# Patient Record
Sex: Female | Born: 1965 | Race: White | Hispanic: No | Marital: Single | Smoking: Never smoker
Health system: Southern US, Community
[De-identification: ages and names within clinical notes are randomized; demographics above are authoritative.]

## PROBLEM LIST (undated history)

## (undated) DIAGNOSIS — E785 Hyperlipidemia, unspecified: Secondary | ICD-10-CM

## (undated) DIAGNOSIS — I10 Essential (primary) hypertension: Secondary | ICD-10-CM

## (undated) DIAGNOSIS — E119 Type 2 diabetes mellitus without complications: Secondary | ICD-10-CM

## (undated) HISTORY — DX: Essential (primary) hypertension: I10

## (undated) HISTORY — DX: Hyperlipidemia, unspecified: E78.5

---

## 2005-02-21 HISTORY — PX: ABDOMINAL HYSTERECTOMY: SHX81

## 2007-02-22 HISTORY — PX: CHOLECYSTECTOMY: SHX55

## 2012-12-18 ENCOUNTER — Other Ambulatory Visit (HOSPITAL_COMMUNITY): Payer: Self-pay | Admitting: Family Medicine

## 2012-12-18 DIAGNOSIS — Z139 Encounter for screening, unspecified: Secondary | ICD-10-CM

## 2012-12-24 ENCOUNTER — Ambulatory Visit (HOSPITAL_COMMUNITY)
Admission: RE | Admit: 2012-12-24 | Discharge: 2012-12-24 | Disposition: A | Discharge status: Home / Self Care | Payer: BC Managed Care – PPO | Source: Ambulatory Visit | Attending: Family Medicine | Admitting: Family Medicine

## 2012-12-24 DIAGNOSIS — Z139 Encounter for screening, unspecified: Secondary | ICD-10-CM

## 2012-12-24 DIAGNOSIS — Z1231 Encounter for screening mammogram for malignant neoplasm of breast: Secondary | ICD-10-CM | POA: Insufficient documentation

## 2012-12-26 ENCOUNTER — Other Ambulatory Visit: Payer: Self-pay | Admitting: Family Medicine

## 2012-12-26 DIAGNOSIS — R928 Other abnormal and inconclusive findings on diagnostic imaging of breast: Secondary | ICD-10-CM

## 2013-01-09 ENCOUNTER — Other Ambulatory Visit: Payer: Self-pay | Admitting: Family Medicine

## 2013-01-09 ENCOUNTER — Ambulatory Visit (HOSPITAL_COMMUNITY)
Admission: RE | Admit: 2013-01-09 | Discharge: 2013-01-09 | Disposition: A | Discharge status: Home / Self Care | Payer: BC Managed Care – PPO | Source: Ambulatory Visit | Attending: Family Medicine | Admitting: Family Medicine

## 2013-01-09 ENCOUNTER — Other Ambulatory Visit (HOSPITAL_COMMUNITY): Payer: Self-pay | Admitting: Family Medicine

## 2013-01-09 DIAGNOSIS — R928 Other abnormal and inconclusive findings on diagnostic imaging of breast: Secondary | ICD-10-CM

## 2013-06-11 ENCOUNTER — Other Ambulatory Visit (HOSPITAL_COMMUNITY): Payer: Self-pay | Admitting: Family Medicine

## 2013-06-11 DIAGNOSIS — N6310 Unspecified lump in the right breast, unspecified quadrant: Secondary | ICD-10-CM

## 2013-06-11 DIAGNOSIS — Z09 Encounter for follow-up examination after completed treatment for conditions other than malignant neoplasm: Secondary | ICD-10-CM

## 2013-07-10 ENCOUNTER — Ambulatory Visit (HOSPITAL_COMMUNITY)
Admission: RE | Admit: 2013-07-10 | Discharge: 2013-07-10 | Disposition: A | Discharge status: Home / Self Care | Payer: BC Managed Care – PPO | Source: Ambulatory Visit | Attending: Family Medicine | Admitting: Family Medicine

## 2013-07-10 DIAGNOSIS — N6009 Solitary cyst of unspecified breast: Secondary | ICD-10-CM | POA: Insufficient documentation

## 2013-07-10 DIAGNOSIS — N6310 Unspecified lump in the right breast, unspecified quadrant: Secondary | ICD-10-CM

## 2013-07-10 DIAGNOSIS — Z09 Encounter for follow-up examination after completed treatment for conditions other than malignant neoplasm: Secondary | ICD-10-CM

## 2014-01-24 ENCOUNTER — Other Ambulatory Visit (HOSPITAL_COMMUNITY): Payer: Self-pay | Admitting: Family Medicine

## 2014-01-24 DIAGNOSIS — Z09 Encounter for follow-up examination after completed treatment for conditions other than malignant neoplasm: Secondary | ICD-10-CM

## 2014-02-04 ENCOUNTER — Ambulatory Visit (HOSPITAL_COMMUNITY)
Admission: RE | Admit: 2014-02-04 | Discharge: 2014-02-04 | Disposition: A | Discharge status: Home / Self Care | Payer: BC Managed Care – PPO | Source: Ambulatory Visit | Attending: Family Medicine | Admitting: Family Medicine

## 2014-02-04 DIAGNOSIS — Z1231 Encounter for screening mammogram for malignant neoplasm of breast: Secondary | ICD-10-CM | POA: Insufficient documentation

## 2014-02-04 DIAGNOSIS — Z09 Encounter for follow-up examination after completed treatment for conditions other than malignant neoplasm: Secondary | ICD-10-CM

## 2014-02-05 ENCOUNTER — Encounter: Payer: Self-pay | Admitting: Neurology

## 2014-02-06 ENCOUNTER — Ambulatory Visit (INDEPENDENT_AMBULATORY_CARE_PROVIDER_SITE_OTHER): Payer: BC Managed Care – PPO | Admitting: Neurology

## 2014-02-06 ENCOUNTER — Encounter: Payer: Self-pay | Admitting: Neurology

## 2014-02-06 VITALS — BP 105/72 | HR 71 | Temp 98.5°F | Ht 63.5 in | Wt 266.0 lb

## 2014-02-06 DIAGNOSIS — G4719 Other hypersomnia: Secondary | ICD-10-CM

## 2014-02-06 DIAGNOSIS — G4733 Obstructive sleep apnea (adult) (pediatric): Secondary | ICD-10-CM

## 2014-02-06 DIAGNOSIS — R351 Nocturia: Secondary | ICD-10-CM

## 2014-02-06 NOTE — Patient Instructions (Signed)

## 2014-02-06 NOTE — Progress Notes (Signed)
Subjective:    Patient ID: Yvonne Mcmillan is a 48 y.o. female.  HPI     Huston FoleySaima Grey Rakestraw, MD, PhD [facility] [address], [address] [address] [place], [place] [address]  Dear Lelon MastSamantha,  I saw your patient, Yvonne EdelsonMarianne Garis, upon your kind request in my neurologic clinic today for initial consultation of her sleep disorder, in particular, concern for underlying obstructive sleep apnea. The patient is unaccompanied today. As you know, Mr. Pricilla Holmucker is a 48 year old right-handed woman with an underlying medical history of hypertension, hyperlipidemia, and morbid obesity, who reports snoring, frequent nighttime awakenings, nonrestorative sleep, witnessed apneas and excessive daytime somnolence. Her Epworth sleepiness score is 16 out of 24 today. She has been trying to lose weight. She has reduced her soda and sweet tea intake to one per day. She is a restless sleep and tosses and turn. She lives with her parents, mother is [id number] and father is [phone]. She is adopted. She is single and has a cat. She has occasional nocturia. She has rare morning HAs. She denies RLS symptoms and is not known to kick in her sleep. His snoring can be mild to loud. Her mother has noted witnessed apneas. The patient herself has had gasping sensations while asleep. She does not wake up rested. She does not drink coffee and drinks alcohol rarely. She is a nonsmoker. She does not watch TV in bed.  Her Past Medical History Is Significant For: Past Medical History  Diagnosis Date  . Hypertension   . Hyperlipemia     Her Past Surgical History Is Significant For: Past Surgical History  Procedure Laterality Date  . Cholecystectomy  2009  . Abdominal hysterectomy  2007    Her Family History Is Significant For: Family History  Problem Relation Age of Onset  . Adopted: Yes  . Family history unknown: Yes    Her Social History Is Significant For: History   Social History  . Marital Status: Single     Spouse Name: N/A    Number of Children: 0  . Years of Education: college   Occupational History  .      [employer]    Social History Main Topics  . Smoking status: Never Smoker   . Smokeless tobacco: Never Used  . Alcohol Use: No  . Drug Use: No  . Sexual Activity: None   Other Topics Concern  . None   Social History Narrative   Patient consumes no caffeine    Her Allergies Are:  No Known Allergies:   Her Current Medications Are:  Outpatient Encounter Prescriptions as of 02/06/2014  Medication Sig  . Fish Oil OIL by Does not apply route daily. 2 gram daily  . lisinopril-hydrochlorothiazide (PRINZIDE,ZESTORETIC) 20-12.5 MG per tablet Take 1 tablet by mouth daily.  . [DISCONTINUED] rosuvastatin (CRESTOR) 20 MG tablet Take 20 mg by mouth daily.  :  Review of Systems:  Out of a complete 14 point review of systems, all are reviewed and negative with the exception of these symptoms as listed below:  Review of Systems  Constitutional: Positive for fatigue.  Musculoskeletal:       Cramps  Neurological:       Snoring    Objective:  Neurologic Exam  Physical Exam Physical Examination:   Filed Vitals:   02/06/14 0943  BP: 105/72  Pulse: 71  Temp: 98.5 F (36.9 C)    General Examination: The patient is a very pleasant 48 y.o. female in no acute distress. She  appears well-developed and well-nourished and well groomed. She is obese.   HEENT: Normocephalic, atraumatic, pupils are equal, round and reactive to light and accommodation. Funduscopic exam is normal with sharp disc margins noted. Extraocular tracking is good without limitation to gaze excursion or nystagmus noted. Normal smooth pursuit is noted. Hearing is grossly intact. Tympanic membranes are clear bilaterally. Face is symmetric with normal facial animation and normal facial sensation. Speech is clear with no dysarthria noted. There is no hypophonia. There is no lip, neck/head, jaw or voice tremor. Neck is supple  with full range of passive and active motion. There are no carotid bruits on auscultation. Oropharynx exam reveals: mild mouth dryness, adequate dental hygiene and moderate airway crowding, due to narrow airway entry, thick soft palate and mildly enlarged uvula, tonsils in place and wider tongue. Mallampati is class II. Tongue protrudes centrally and palate elevates symmetrically. Tonsils are 1+ to 2+ in size. Neck size is 18 inches. She has a Mild overbite. Nasal inspection reveals no significant nasal mucosal bogginess or redness and no septal deviation.   Chest: Clear to auscultation without wheezing, rhonchi or crackles noted.  Heart: S1+S2+0, regular and normal without murmurs, rubs or gallops noted.   Abdomen: Soft, non-tender and non-distended with normal bowel sounds appreciated on auscultation.  Extremities: There is no pitting edema in the distal lower extremities bilaterally. Pedal pulses are intact.  Skin: Warm and dry without trophic changes noted. There are no varicose veins.  Musculoskeletal: exam reveals no obvious joint deformities, tenderness or joint swelling or erythema.   Neurologically:  Mental status: The patient is awake, alert and oriented in all 4 spheres. Her immediate and remote memory, attention, language skills and fund of knowledge are appropriate. There is no evidence of aphasia, agnosia, apraxia or anomia. Speech is clear with normal prosody and enunciation. Thought process is linear. Mood is normal and affect is normal.  Cranial nerves II - XII are as described above under HEENT exam. In addition: shoulder shrug is normal with equal shoulder height noted. Motor exam: Normal bulk, strength and tone is noted. There is no drift, tremor or rebound. Romberg is negative. Reflexes are 2+ throughout. Babinski: Toes are flexor bilaterally. Fine motor skills and coordination: intact with normal finger taps, normal hand movements, normal rapid alternating patting, normal foot  taps and normal foot agility.  Cerebellar testing: No dysmetria or intention tremor on finger to nose testing. Heel to shin is unremarkable bilaterally. There is no truncal or gait ataxia.  Sensory exam: intact to light touch, pinprick, vibration, temperature sense in the upper and lower extremities.  Gait, station and balance: She stands easily. No veering to one side is noted. No leaning to one side is noted. Posture is age-appropriate and stance is narrow based. Gait shows normal stride length and normal pace. No problems turning are noted. She turns en bloc. Tandem walk is unremarkable. Intact toe and heel stance is noted.               Assessment and Plan:   In summary, Addisynn Vassell is a very pleasant 48 y.o.-year old female hypertension, hyperlipidemia, and morbid obesity, who reports snoring, frequent nighttime awakenings, nonrestorative sleep, witnessed apneas and excessive daytime somnolence, with a history and physical exam highly concerning for obstructive sleep apnea (OSA). I had a long chat with the patient about my findings and the diagnosis of OSA, its prognosis and treatment options. We talked about medical treatments, surgical interventions and non-pharmacological approaches. I explained  in particular the risks and ramifications of untreated moderate to severe OSA, especially with respect to developing cardiovascular disease down the Road, including congestive heart failure, difficult to treat hypertension, cardiac arrhythmias, or stroke. Even type 2 diabetes has, in part, been linked to untreated OSA. Symptoms of untreated OSA include daytime sleepiness, memory problems, mood irritability and mood disorder such as depression and anxiety, lack of energy, as well as recurrent headaches, especially morning headaches. We talked about trying to maintain a healthy lifestyle in general, as well as the importance of weight control. I encouraged the patient to eat healthy, exercise daily and keep  well hydrated, to keep a scheduled bedtime and wake time routine, to not skip any meals and eat healthy snacks in between meals. I advised the patient not to drive when feeling sleepy. I recommended the following at this time: sleep study with potential positive airway pressure titration. (We will score hypopneas at 3% and split the sleep study into diagnostic and treatment portion, if the estimated. 2 hour AHI is >15/h).   I explained the sleep test procedure to the patient and also outlined possible surgical and non-surgical treatment options of OSA, including the use of a custom-made dental device (which would require a referral to a specialist dentist or oral surgeon), upper airway surgical options, such as pillar implants, radiofrequency surgery, tongue base surgery, and UPPP (which would involve a referral to an ENT surgeon). Rarely, jaw surgery such as mandibular advancement may be considered.  I also explained the CPAP treatment option to the patient, who indicated that she would be willing to try CPAP if the need arises. I explained the importance of being compliant with PAP treatment, not only for insurance purposes but primarily to improve Her symptoms, and for the patient's long term health benefit, including to reduce Her cardiovascular risks. I answered all her questions today and the patient was in agreement. I would like to see her back after the sleep study is completed and encouraged her to call with any interim questions, concerns, problems or updates.   Thank you very much for allowing me to participate in the care of this nice patient. If I can be of any further assistance to you please do not hesitate to call me at [phone].  Sincerely,   Huston FoleySaima Vail Vuncannon, MD, PhD

## 2014-03-10 ENCOUNTER — Telehealth: Payer: Self-pay | Admitting: Neurology

## 2014-03-10 NOTE — Telephone Encounter (Signed)
I called the patient to review her coverage for the sleep study.  Pt has a very high deductible and she will be unable to afford the sleep study at this time.

## 2017-04-24 ENCOUNTER — Other Ambulatory Visit (HOSPITAL_COMMUNITY): Payer: Self-pay | Admitting: Family Medicine

## 2017-04-24 DIAGNOSIS — Z1231 Encounter for screening mammogram for malignant neoplasm of breast: Secondary | ICD-10-CM

## 2017-12-11 ENCOUNTER — Other Ambulatory Visit (HOSPITAL_COMMUNITY): Payer: Self-pay | Admitting: Family Medicine

## 2017-12-11 DIAGNOSIS — Z1231 Encounter for screening mammogram for malignant neoplasm of breast: Secondary | ICD-10-CM

## 2017-12-18 ENCOUNTER — Ambulatory Visit (HOSPITAL_COMMUNITY): Payer: Self-pay

## 2017-12-27 ENCOUNTER — Encounter (HOSPITAL_COMMUNITY): Payer: Self-pay

## 2017-12-27 ENCOUNTER — Ambulatory Visit (HOSPITAL_COMMUNITY)
Admission: RE | Admit: 2017-12-27 | Discharge: 2017-12-27 | Disposition: A | Discharge status: Home / Self Care | Payer: BLUE CROSS/BLUE SHIELD | Source: Ambulatory Visit | Attending: Family Medicine | Admitting: Family Medicine

## 2017-12-27 DIAGNOSIS — Z1231 Encounter for screening mammogram for malignant neoplasm of breast: Secondary | ICD-10-CM | POA: Diagnosis not present

## 2017-12-28 ENCOUNTER — Other Ambulatory Visit (HOSPITAL_COMMUNITY): Payer: Self-pay | Admitting: Family Medicine

## 2017-12-28 DIAGNOSIS — R928 Other abnormal and inconclusive findings on diagnostic imaging of breast: Secondary | ICD-10-CM

## 2018-01-02 ENCOUNTER — Ambulatory Visit (HOSPITAL_COMMUNITY)
Admission: RE | Admit: 2018-01-02 | Discharge: 2018-01-02 | Disposition: A | Discharge status: Home / Self Care | Payer: BLUE CROSS/BLUE SHIELD | Source: Ambulatory Visit | Attending: Family Medicine | Admitting: Family Medicine

## 2018-01-02 ENCOUNTER — Encounter (HOSPITAL_COMMUNITY): Payer: Self-pay

## 2018-01-02 DIAGNOSIS — R928 Other abnormal and inconclusive findings on diagnostic imaging of breast: Secondary | ICD-10-CM

## 2019-05-16 ENCOUNTER — Ambulatory Visit: Payer: BC Managed Care – PPO | Attending: Internal Medicine

## 2019-05-16 DIAGNOSIS — Z23 Encounter for immunization: Secondary | ICD-10-CM

## 2019-05-16 NOTE — Progress Notes (Signed)
   Covid-19 Vaccination Clinic  Name:  Yvonne Mcmillan    MRN: [id number] DOB: Sep 02, 1965  05/16/2019  Yvonne Mcmillan was observed post Covid-19 immunization for 15 minutes without incident. She was provided with Vaccine Information Sheet and instruction to access the V-Safe system.   Yvonne Mcmillan was instructed to call 911 with any severe reactions post vaccine: Marland Kitchen Difficulty breathing  . Swelling of face and throat  . A fast heartbeat  . A bad rash all over body  . Dizziness and weakness   Immunizations Administered    Name Date Dose VIS Date Route   Moderna COVID-19 Vaccine 05/16/2019 10:27 AM 0.5 mL 01/22/2019 Intramuscular   Manufacturer: Moderna   Lot: [id number]   NDC: 51761-607-37

## 2019-06-17 ENCOUNTER — Other Ambulatory Visit (HOSPITAL_COMMUNITY): Payer: Self-pay | Admitting: Family Medicine

## 2019-06-17 DIAGNOSIS — Z1231 Encounter for screening mammogram for malignant neoplasm of breast: Secondary | ICD-10-CM

## 2019-06-18 ENCOUNTER — Ambulatory Visit: Payer: BC Managed Care – PPO | Attending: Internal Medicine

## 2019-06-18 DIAGNOSIS — Z23 Encounter for immunization: Secondary | ICD-10-CM

## 2019-06-18 NOTE — Progress Notes (Signed)
   Covid-19 Vaccination Clinic  Name:  Yvonne Mcmillan    MRN: [id number] DOB: 01-Oct-1965  06/18/2019  [name] was observed post Covid-19 immunization for 15 minutes without incident. She was provided with Vaccine Information Sheet and instruction to access the V-Safe system.   [name] was instructed to call 911 with any severe reactions post vaccine: Marland Kitchen Difficulty breathing  . Swelling of face and throat  . A fast heartbeat  . A bad rash all over body  . Dizziness and weakness   Immunizations Administered    Name Date Dose VIS Date Route   Moderna COVID-19 Vaccine 06/18/2019 10:37 AM 0.5 mL 01/2019 Intramuscular   Manufacturer: Moderna   Lot: [id number]   NDC: 91694-503-88

## 2019-08-05 ENCOUNTER — Ambulatory Visit (HOSPITAL_COMMUNITY)
Admission: RE | Admit: 2019-08-05 | Discharge: 2019-08-05 | Disposition: A | Discharge status: Home / Self Care | Payer: BC Managed Care – PPO | Source: Ambulatory Visit | Attending: Family Medicine | Admitting: Family Medicine

## 2019-08-05 ENCOUNTER — Other Ambulatory Visit: Payer: Self-pay

## 2019-08-05 DIAGNOSIS — Z1231 Encounter for screening mammogram for malignant neoplasm of breast: Secondary | ICD-10-CM | POA: Diagnosis present

## 2019-09-30 IMAGING — MG DIGITAL DIAGNOSTIC UNILATERAL RIGHT MAMMOGRAM WITH TOMO AND CAD
4 series · 4 of 12 positions shown · non-contrast
Comparison: Previous exam(s).

CLINICAL DATA: Screening recall for right breast mass.

EXAM:
DIGITAL DIAGNOSTIC UNILATERAL RIGHT MAMMOGRAM WITH CAD AND TOMO
RIGHT BREAST ULTRASOUND

[R CC synth-2D]
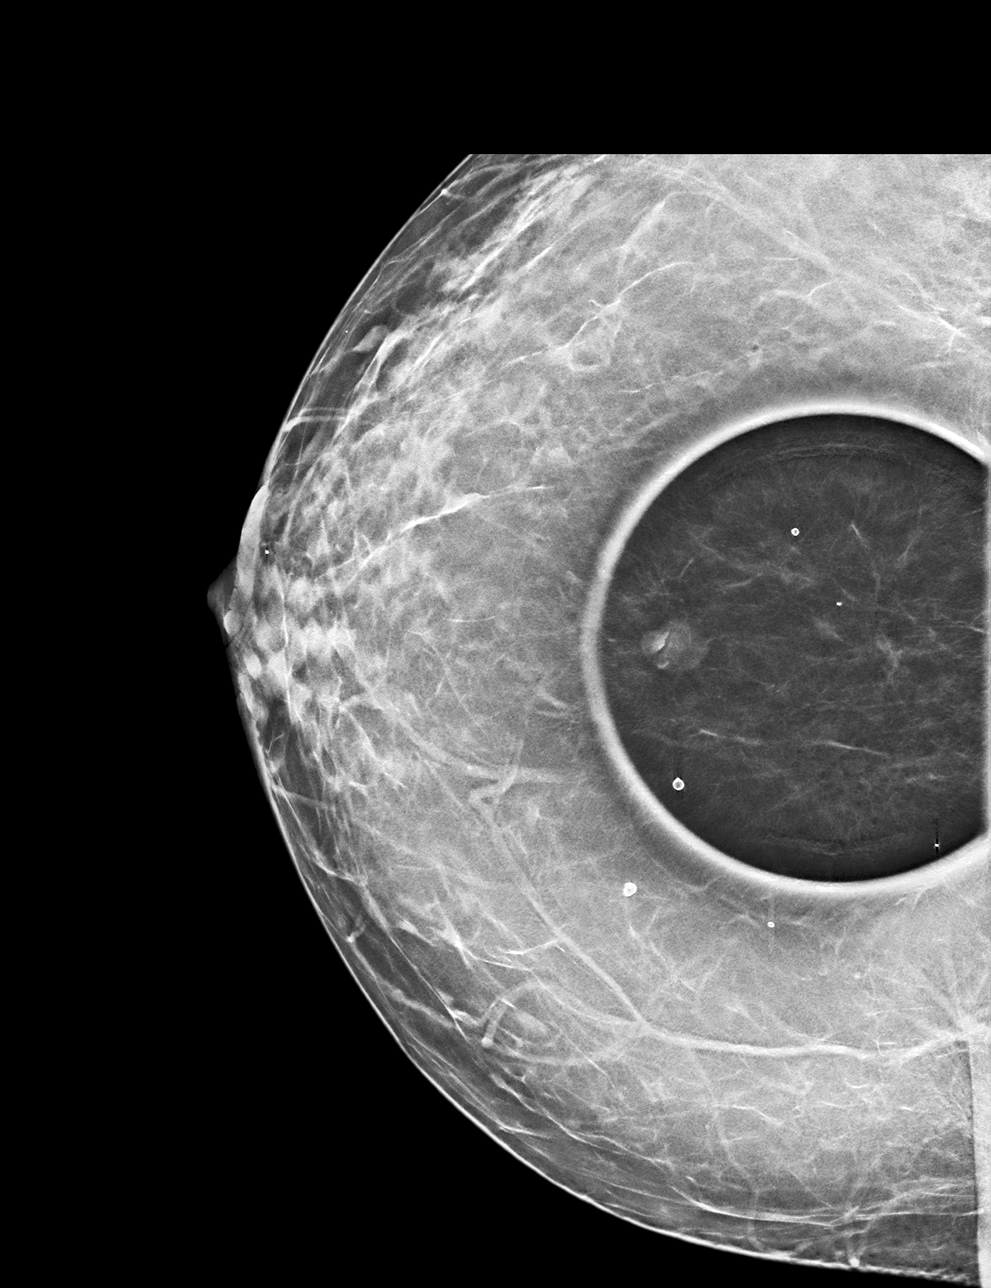

[R MLO synth-2D]
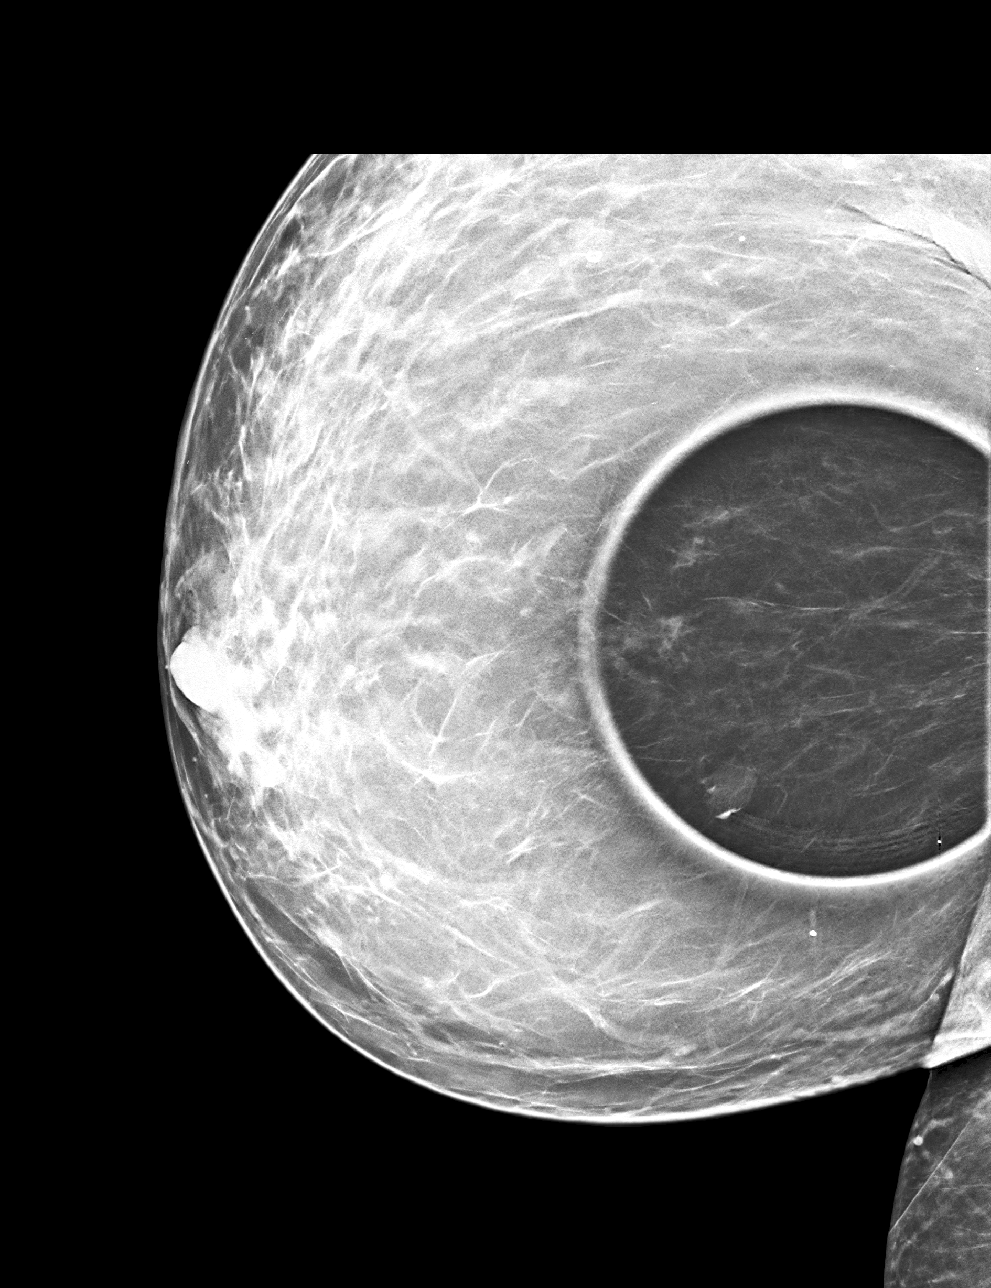

[R MLO tomo · tomo slice 28/55.0]
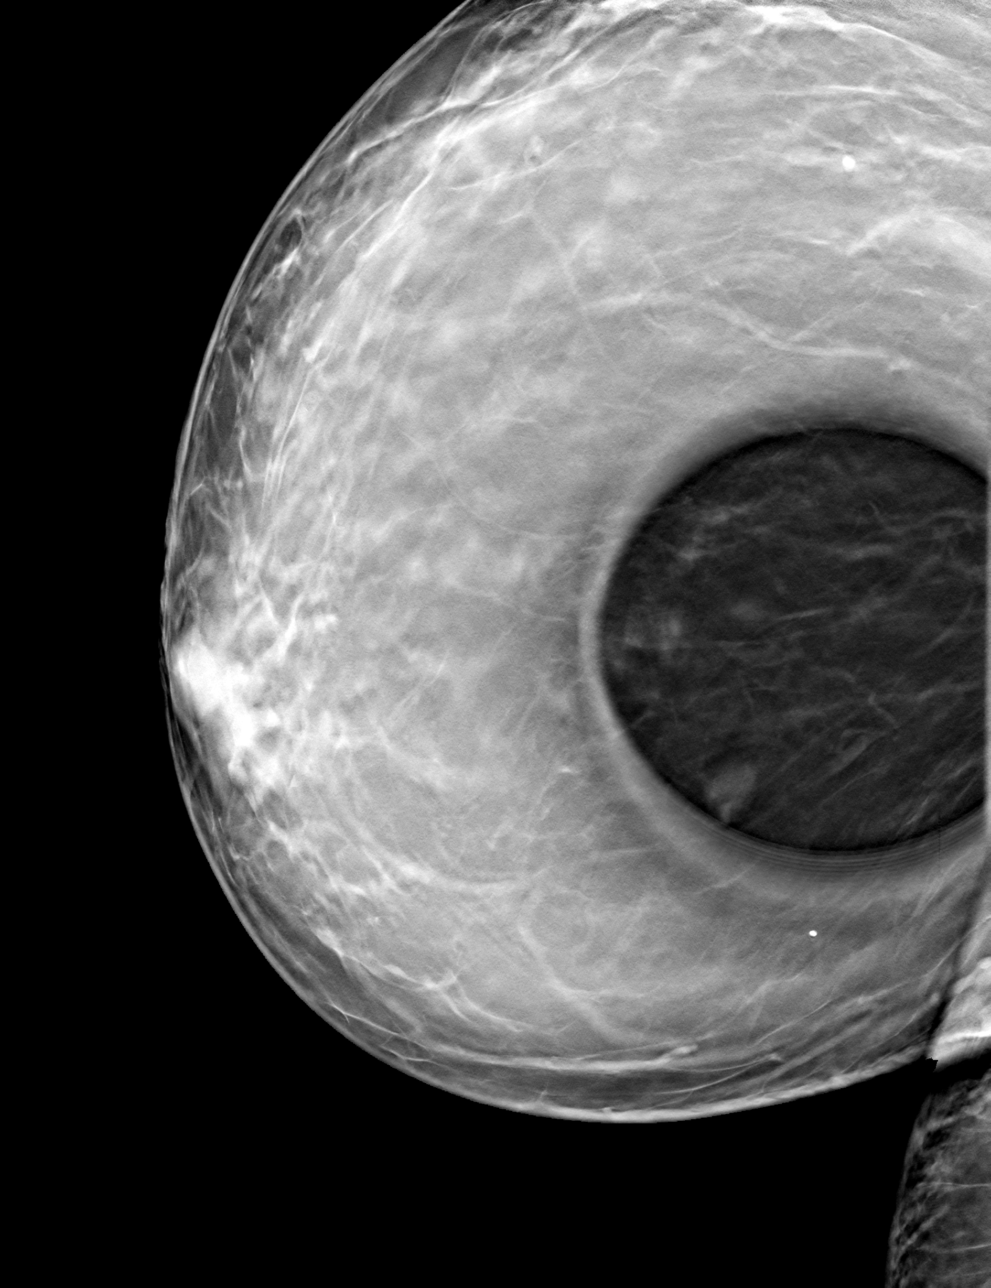

[R CC tomo · tomo slice 25/50.0]
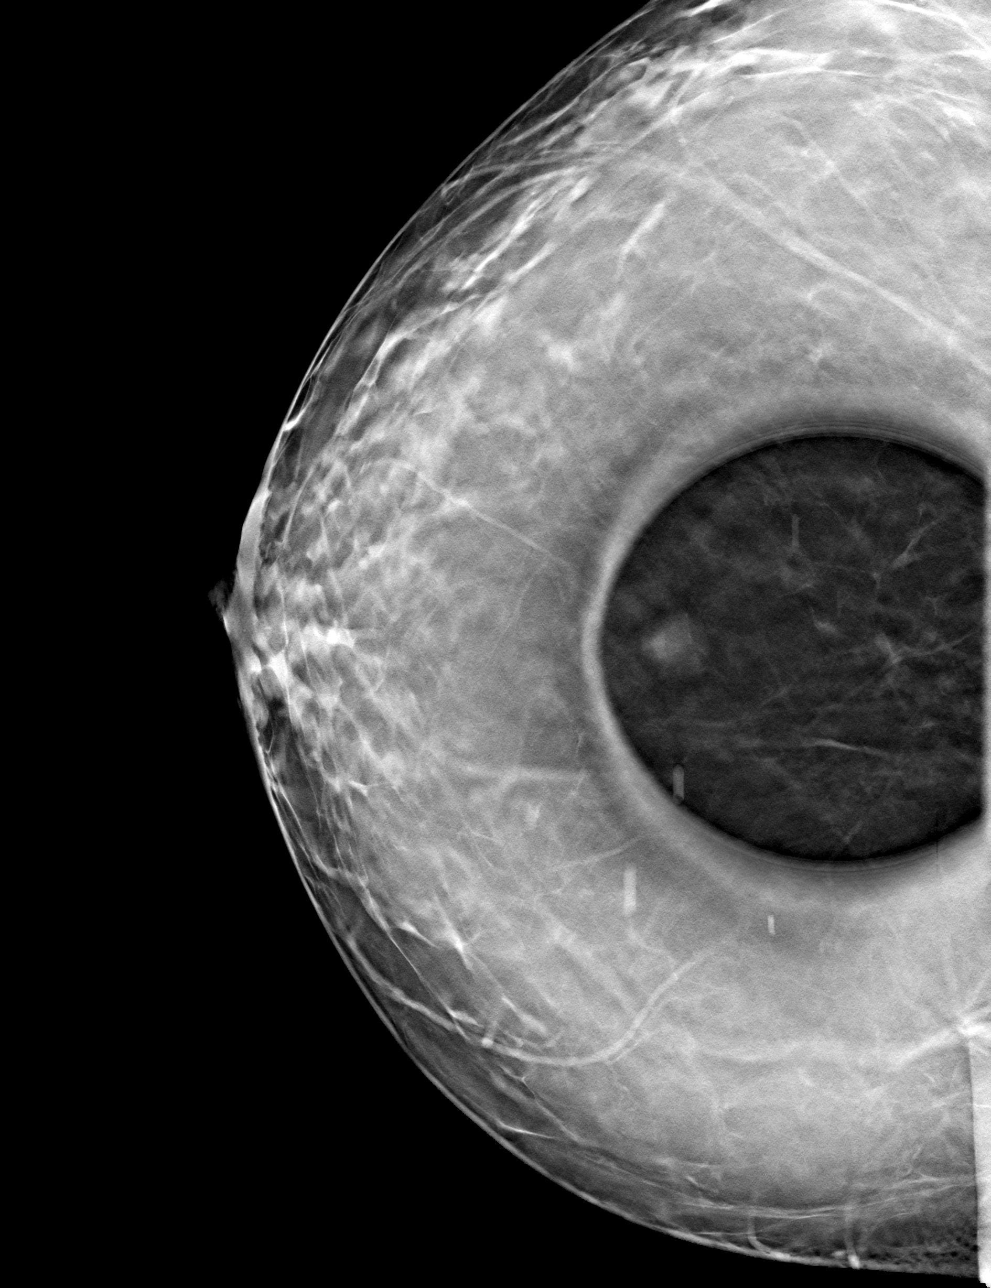

[4 of 12 positions shown; findings below may reference images not displayed]

ACR Breast Density Category b: There are scattered areas of
fibroglandular density.
FINDINGS: Additional tomograms were performed of the right breast. There is an
oval circumscribed mass in the lower central right breast measuring
0.9 cm and demonstrating layering calcifications on the MLO
tomograms suggestive of milk of calcium within a benign cyst.

Mammographic images were processed with CAD.

Targeted ultrasound of the lower right breast was performed
demonstrating a cluster of cysts at 6 o'clock 6 cm from nipple
containing layering debris/calcifications measuring 0.8 x 0.2 x
cm. This corresponds well with the mass seen in the lower right
breast at mammography.
IMPRESSION: No findings of malignancy in the right breast.

RECOMMENDATION:
Screening mammogram in one year.(Code:A0-Y-1FS)

I have discussed the findings and recommendations with the patient.
Results were also provided in writing at the conclusion of the
visit. If applicable, a reminder letter will be sent to the patient
regarding the next appointment.

BI-RADS CATEGORY  2: Benign.

## 2019-09-30 IMAGING — US ULTRASOUND RIGHT BREAST LIMITED
1 series · 5 of 5 positions shown · non-contrast
Comparison: Previous exam(s).

CLINICAL DATA: Screening recall for right breast mass.

EXAM:
DIGITAL DIAGNOSTIC UNILATERAL RIGHT MAMMOGRAM WITH CAD AND TOMO
RIGHT BREAST ULTRASOUND

[Series 1: ultrasound right breast limited · 0.07mm/px · 5 of 5 slices shown]
[im 1/5]
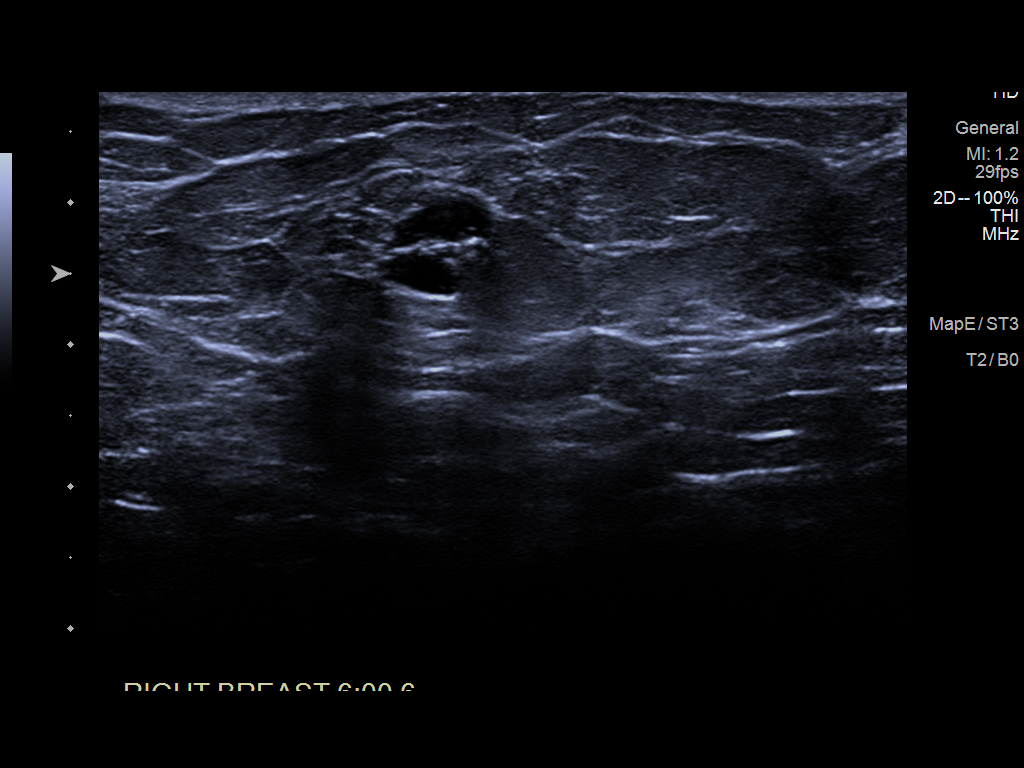
[im 2/5]
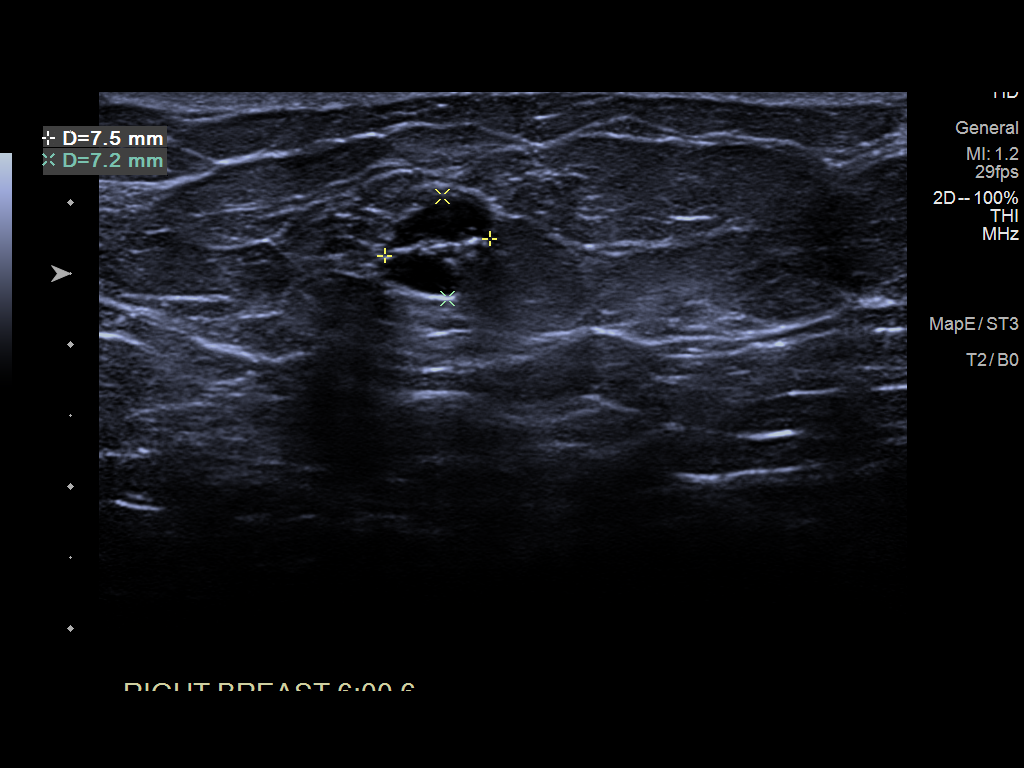
[im 3/5]
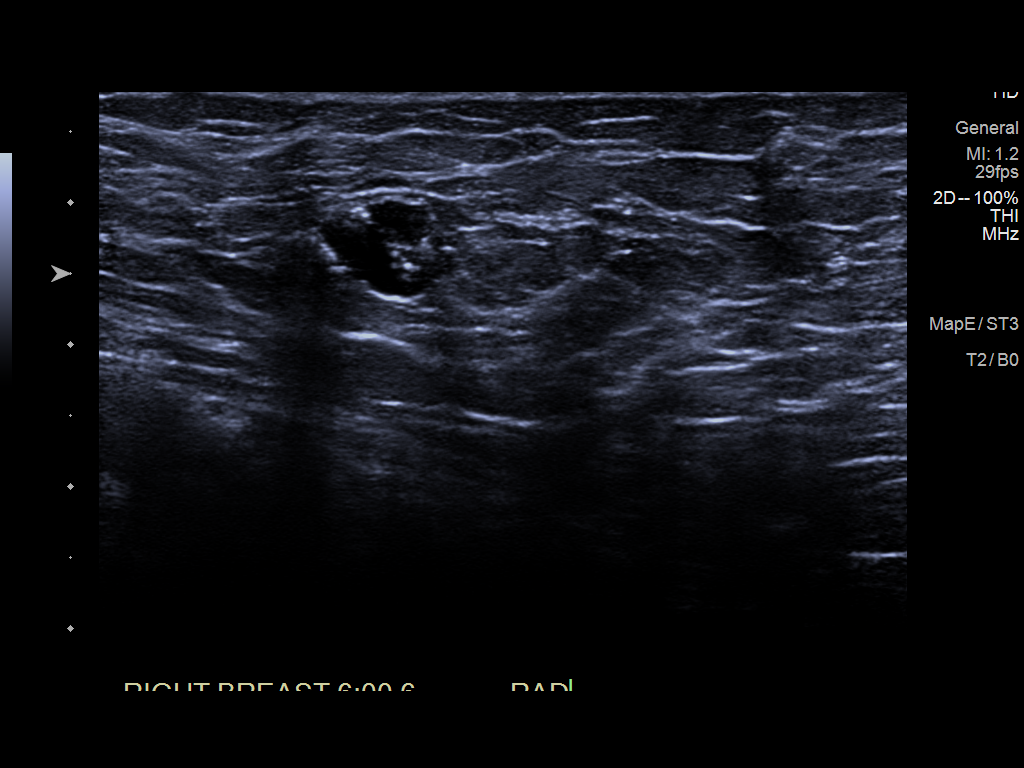
[im 4/5]
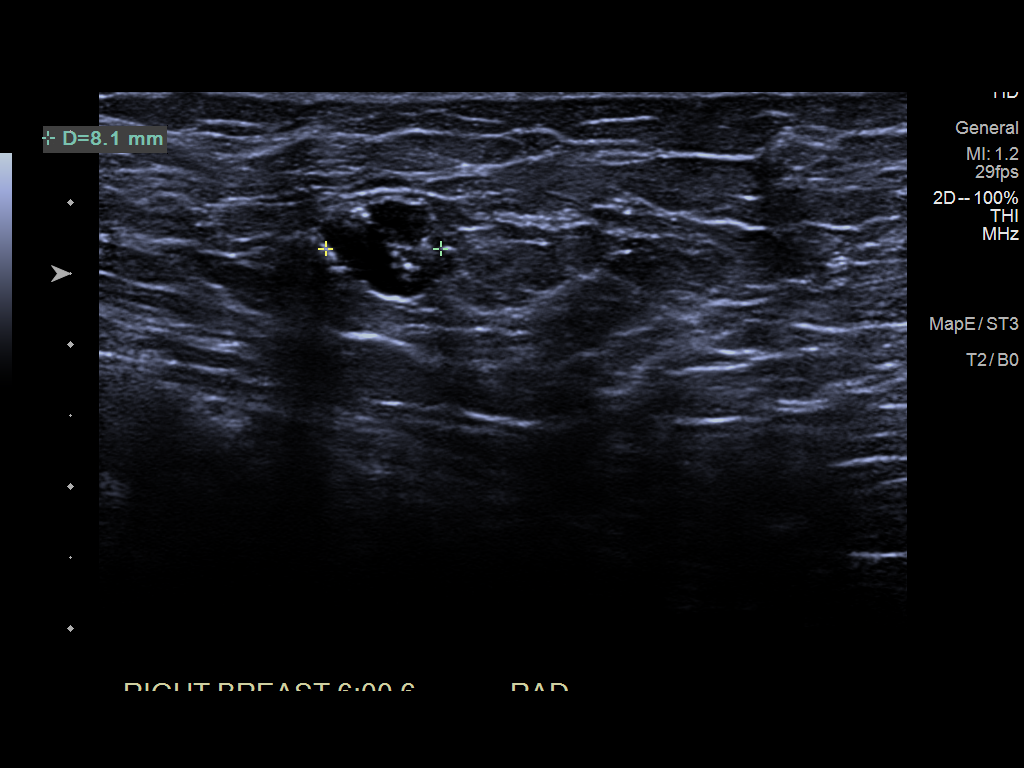
[im 5/5]
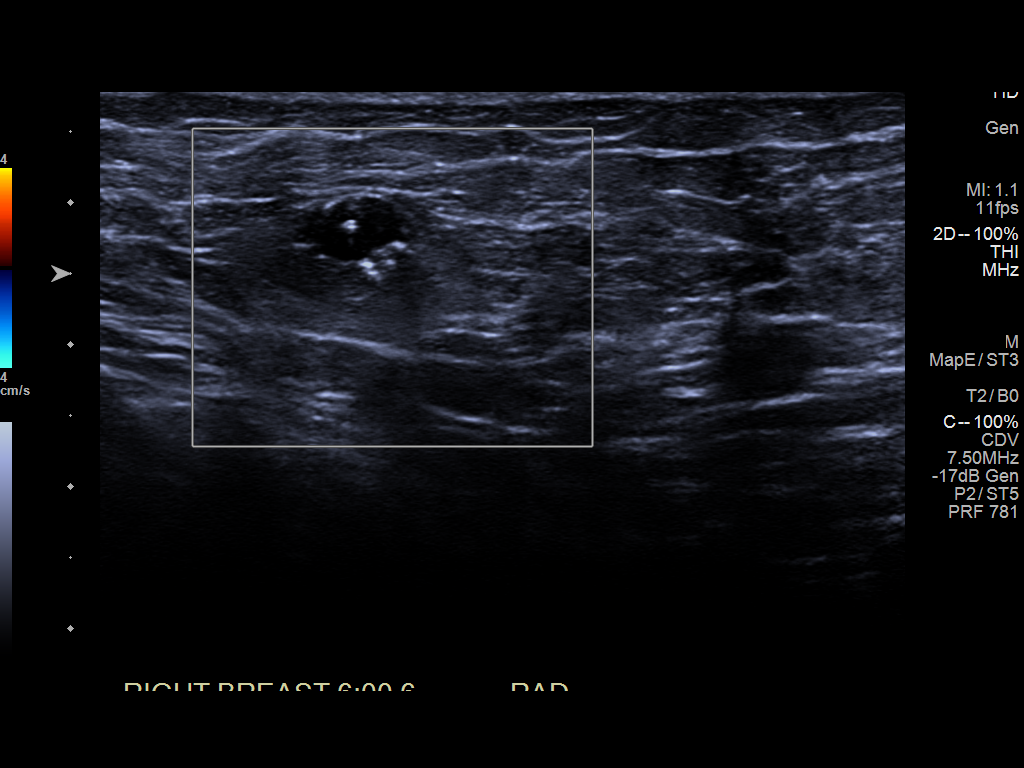

[5 of 5 positions shown; findings below may reference images not displayed]

ACR Breast Density Category b: There are scattered areas of
fibroglandular density.
FINDINGS: Additional tomograms were performed of the right breast. There is an
oval circumscribed mass in the lower central right breast measuring
0.9 cm and demonstrating layering calcifications on the MLO
tomograms suggestive of milk of calcium within a benign cyst.

Mammographic images were processed with CAD.

Targeted ultrasound of the lower right breast was performed
demonstrating a cluster of cysts at 6 o'clock 6 cm from nipple
containing layering debris/calcifications measuring 0.8 x 0.2 x
cm. This corresponds well with the mass seen in the lower right
breast at mammography.
IMPRESSION: No findings of malignancy in the right breast.

RECOMMENDATION:
Screening mammogram in one year.(Code:A0-Y-1FS)

I have discussed the findings and recommendations with the patient.
Results were also provided in writing at the conclusion of the
visit. If applicable, a reminder letter will be sent to the patient
regarding the next appointment.

BI-RADS CATEGORY  2: Benign.

## 2021-01-13 ENCOUNTER — Other Ambulatory Visit: Payer: Self-pay

## 2021-01-13 ENCOUNTER — Emergency Department (HOSPITAL_COMMUNITY)
Admission: EM | Admit: 2021-01-13 | Discharge: 2021-01-13 | Disposition: A | Discharge status: Home / Self Care | Payer: BC Managed Care – PPO | Attending: Emergency Medicine | Admitting: Emergency Medicine

## 2021-01-13 ENCOUNTER — Emergency Department (HOSPITAL_COMMUNITY): Payer: BC Managed Care – PPO

## 2021-01-13 ENCOUNTER — Encounter (HOSPITAL_COMMUNITY): Payer: Self-pay | Admitting: Student

## 2021-01-13 DIAGNOSIS — R519 Headache, unspecified: Secondary | ICD-10-CM | POA: Diagnosis not present

## 2021-01-13 DIAGNOSIS — Z20822 Contact with and (suspected) exposure to covid-19: Secondary | ICD-10-CM | POA: Diagnosis not present

## 2021-01-13 DIAGNOSIS — I10 Essential (primary) hypertension: Secondary | ICD-10-CM | POA: Diagnosis not present

## 2021-01-13 DIAGNOSIS — R059 Cough, unspecified: Secondary | ICD-10-CM | POA: Diagnosis not present

## 2021-01-13 DIAGNOSIS — Z79899 Other long term (current) drug therapy: Secondary | ICD-10-CM | POA: Insufficient documentation

## 2021-01-13 DIAGNOSIS — R0981 Nasal congestion: Secondary | ICD-10-CM | POA: Insufficient documentation

## 2021-01-13 DIAGNOSIS — R5383 Other fatigue: Secondary | ICD-10-CM | POA: Diagnosis not present

## 2021-01-13 DIAGNOSIS — J111 Influenza due to unidentified influenza virus with other respiratory manifestations: Secondary | ICD-10-CM

## 2021-01-13 DIAGNOSIS — E119 Type 2 diabetes mellitus without complications: Secondary | ICD-10-CM | POA: Diagnosis not present

## 2021-01-13 HISTORY — DX: Type 2 diabetes mellitus without complications: E11.9

## 2021-01-13 LAB — RESP PANEL BY RT-PCR (FLU A&B, COVID) ARPGX2
Influenza A by PCR: NEGATIVE
Influenza B by PCR: NEGATIVE
SARS Coronavirus 2 by RT PCR: NEGATIVE

## 2021-01-13 MED ORDER — BENZONATATE 100 MG PO CAPS: 100.0000 mg | ORAL_CAPSULE | Freq: Three times a day (TID) | ORAL | 0 refills | Status: DC | PRN

## 2021-01-13 NOTE — Discharge Instructions (Addendum)
1. Medications: Tessalon, usual home medications 2. Treatment: rest, drink plenty of fluids, alternate tylenol and ibuprofen for fever control - do not exceed 4g of tylenol including tylenol in other cold and flu medications 3. Follow Up: Please followup with your primary doctor in 3-5 days for discussion of your diagnoses and further evaluation after today's visit; if you do not have a primary care doctor use the resource guide provided to find one; Please return to the ER for difficulty breathing, inability to keep down fluids, altered mental status or other concerns.

## 2021-01-13 NOTE — ED Provider Notes (Signed)
[facility] EMERGENCY DEPARTMENT Provider Note   CSN: [id number] Arrival date & time: 01/13/21  0206     History No chief complaint on file.   Yvonne Mcmillan is a 55 y.o. female presents to the emergency room for cough and congestion onset tonight.  Pt reports her cough is productive of thick, dark yellow sputum.  She reports associated headache but denies fevers, chills, vision changes, neck pain or neck stiffness.  No known sick contacts.  Patient reports due to the congestion she had taken Tylenol Cold and flu this before 7:30 PM.  Patient also with fatigue and myalgias.  No specific aggravating or alleviating factors.  The history is provided by the patient and medical records. No language interpreter was used.      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   Past Surgical History:  Procedure Laterality Date   ABDOMINAL HYSTERECTOMY  2007   CHOLECYSTECTOMY  2009     OB History   No obstetric history on file.     Family History  Adopted: Yes  Family history unknown: Yes    Social History   Tobacco Use   Smoking status: Never   Smokeless tobacco: Never  Substance Use Topics   Alcohol use: No    Alcohol/week: 0.0 standard drinks   Drug use: No    Home Medications Prior to Admission medications   Medication Sig Start Date End Date Taking? Authorizing Provider  benzonatate (TESSALON PERLES) 100 MG capsule Take 1 capsule (100 mg total) by mouth 3 (three) times daily as needed for cough (cough). 01/13/21  Yes Promiss Labarbera, Dahlia Client, PA-C  Fish Oil OIL by Does not apply route daily. 2 gram daily    [provider]  lisinopril-hydrochlorothiazide (PRINZIDE,ZESTORETIC) 20-12.5 MG per tablet Take 1 tablet by mouth daily.    [provider]    Allergies    Patient has no known allergies.  Review of Systems    Review of Systems  Constitutional:  Positive for fatigue. Negative for appetite change, diaphoresis, fever and unexpected weight change.  HENT:  Negative for mouth sores.   Eyes:  Negative for visual disturbance.  Respiratory:  Positive for cough. Negative for chest tightness, shortness of breath and wheezing.   Cardiovascular:  Negative for chest pain.  Gastrointestinal:  Negative for abdominal pain, constipation, diarrhea, nausea and vomiting.  Endocrine: Negative for polydipsia, polyphagia and polyuria.  Genitourinary:  Negative for dysuria, frequency, hematuria and urgency.  Musculoskeletal:  Positive for myalgias. Negative for back pain and neck stiffness.  Skin:  Negative for rash.  Allergic/Immunologic: Negative for immunocompromised state.  Neurological:  Positive for headaches. Negative for syncope and light-headedness.  Hematological:  Does not bruise/bleed easily.  Psychiatric/Behavioral:  Negative for sleep disturbance. The patient is not nervous/anxious.    Physical Exam Updated Vital Signs BP (!) 153/99   Pulse 96   Temp 98.1 F (36.7 C)   Resp 19   Ht 5\' 1"  (1.549 m)   Wt 115.7 kg   SpO2 97%   BMI 48.18 kg/m   Physical Exam Vitals and nursing note reviewed.  Constitutional:      General: She is not in acute distress.    Appearance: She is not diaphoretic.  HENT:     Head: Normocephalic.  Eyes:     General: No  scleral icterus.    Conjunctiva/sclera: Conjunctivae normal.  Cardiovascular:     Rate and Rhythm: Normal rate and regular rhythm.     Pulses: Normal pulses.          Radial pulses are 2+ on the right side and 2+ on the left side.  Pulmonary:     Effort: No tachypnea, accessory muscle usage, prolonged expiration, respiratory distress or retractions.     Breath sounds: No stridor.     Comments: Equal chest rise. No increased work of breathing. Congested cough Abdominal:     General: There is no distension.     Palpations: Abdomen is soft.      Tenderness: There is no abdominal tenderness. There is no guarding or rebound.  Musculoskeletal:     Cervical back: Normal range of motion.     Comments: Moves all extremities equally and without difficulty.  Skin:    General: Skin is warm and dry.     Capillary Refill: Capillary refill takes less than 2 seconds.  Neurological:     Mental Status: She is alert.     GCS: GCS eye subscore is 4. GCS verbal subscore is 5. GCS motor subscore is 6.     Comments: Speech is clear and goal oriented.  Psychiatric:        Mood and Affect: Mood normal.    ED Results / Procedures / Treatments   Labs (all labs ordered are listed, but only abnormal results are displayed) Labs Reviewed  RESP PANEL BY RT-PCR (FLU A&B, COVID) [id number]     Radiology DG Chest 2 View  Result Date: 01/13/2021 CLINICAL DATA:  Cough.  Headache. EXAM: CHEST - 2 VIEW COMPARISON:  None. FINDINGS: The heart size and mediastinal contours are within normal limits. Both lungs are clear. The visualized skeletal structures are unremarkable. IMPRESSION: Negative for pneumonia. Electronically Signed   By: Tiburcio PeaJonathan  Watts M.D.   On: 01/13/2021 05:01    Procedures Procedures   Medications Ordered in ED Medications - No data to display  ED Course  I have reviewed the triage vital signs and the nursing notes.  Pertinent labs & imaging results that were available during my care of the patient were reviewed by me and considered in my medical decision making (see chart for details).    MDM Rules/Calculators/A&P                           Patient presents to the emergency department with complaints of congestion and cough.  Tonight cough worsened and mucus became colored.  Chest x-ray without evidence of pneumonia or pulmonary edema.  Breath sounds are clear and equal.  Afebrile here in the emergency department.  No respiratory distress or hypoxia.  No tachycardia or palpitations.  Highly doubt pulmonary embolism.  Suspect influenza.   COVID and influenza test negative here however suspect false negative.  Will treat with conservative therapies.  Discussed reasons return to the emergency department.  Patient states understanding and is in agreement with the plan.   Final Clinical Impression(s) / ED Diagnoses Final diagnoses:  Influenza-like illness    Rx / DC Orders ED Discharge Orders          Ordered    benzonatate (TESSALON PERLES) 100 MG capsule  3 times daily PRN        01/13/21 0614             Hyacinth Marcelli, Dahlia ClientHannah, PA-C 01/13/21 0622  Ezequiel Essex, MD 01/13/21 [phone]

## 2021-01-13 NOTE — ED Triage Notes (Signed)
Pov from home with cc of cough with thick phlegm .said it was initially just congestion. Also c/o headache. No one sick at home.  Unaware of any fevers. Took tylenol flu a little before 730 pm.

## 2021-01-13 NOTE — ED Notes (Signed)
Patient transported to X-ray 

## 2022-03-04 ENCOUNTER — Emergency Department (HOSPITAL_COMMUNITY)
Admission: EM | Admit: 2022-03-04 | Discharge: 2022-03-04 | Disposition: A | Discharge status: Home / Self Care | Payer: BC Managed Care – PPO | Attending: Emergency Medicine | Admitting: Emergency Medicine

## 2022-03-04 ENCOUNTER — Other Ambulatory Visit: Payer: Self-pay

## 2022-03-04 ENCOUNTER — Emergency Department (HOSPITAL_COMMUNITY): Payer: BC Managed Care – PPO

## 2022-03-04 DIAGNOSIS — Z79899 Other long term (current) drug therapy: Secondary | ICD-10-CM | POA: Insufficient documentation

## 2022-03-04 DIAGNOSIS — E876 Hypokalemia: Secondary | ICD-10-CM | POA: Insufficient documentation

## 2022-03-04 DIAGNOSIS — R9431 Abnormal electrocardiogram [ECG] [EKG]: Secondary | ICD-10-CM | POA: Diagnosis not present

## 2022-03-04 DIAGNOSIS — J189 Pneumonia, unspecified organism: Secondary | ICD-10-CM | POA: Diagnosis not present

## 2022-03-04 DIAGNOSIS — R030 Elevated blood-pressure reading, without diagnosis of hypertension: Secondary | ICD-10-CM

## 2022-03-04 DIAGNOSIS — R0602 Shortness of breath: Secondary | ICD-10-CM | POA: Diagnosis present

## 2022-03-04 LAB — BASIC METABOLIC PANEL
Anion gap: 10 (ref 5–15)
BUN: 15 mg/dL (ref 6–20)
CO2: 23 mmol/L (ref 22–32)
Calcium: 8.8 mg/dL — ABNORMAL LOW (ref 8.9–10.3)
Chloride: 109 mmol/L (ref 98–111)
Creatinine, Ser: 0.97 mg/dL (ref 0.44–1.00)
GFR, Estimated: >60 mL/min (ref >60)
Glucose, Bld: 166 mg/dL — ABNORMAL HIGH (ref 70–99)
Potassium: 3 mmol/L — ABNORMAL LOW (ref 3.5–5.1)
Sodium: 142 mmol/L (ref 135–145)

## 2022-03-04 LAB — CBC WITH DIFFERENTIAL/PLATELET
Abs Immature Granulocytes: 0.05 10*3/uL (ref 0.00–0.07)
Basophils Absolute: 0.1 10*3/uL (ref 0.0–0.1)
Basophils Relative: 1 %
Eosinophils Absolute: 0.5 10*3/uL (ref 0.0–0.5)
Eosinophils Relative: 5 %
HCT: 42.6 % (ref 36.0–46.0)
Hemoglobin: 13.9 g/dL (ref 12.0–15.0)
Immature Granulocytes: 1 %
Lymphocytes Relative: 31 %
Lymphs Abs: 2.9 10*3/uL (ref 0.7–4.0)
MCH: 31.7 pg (ref 26.0–34.0)
MCHC: 32.6 g/dL (ref 30.0–36.0)
MCV: 97 fL (ref 80.0–100.0)
Monocytes Absolute: 0.6 10*3/uL (ref 0.1–1.0)
Monocytes Relative: 7 %
Neutro Abs: 5.1 10*3/uL (ref 1.7–7.7)
Neutrophils Relative %: 55 %
Platelets: 288 10*3/uL (ref 150–400)
RBC: 4.39 MIL/uL (ref 3.87–5.11)
RDW: 13.8 % (ref 11.5–15.5)
WBC: 9.2 10*3/uL (ref 4.0–10.5)
nRBC: 0 % (ref 0.0–0.2)

## 2022-03-04 LAB — TROPONIN I (HIGH SENSITIVITY)
Troponin I (High Sensitivity): 23 ng/L — ABNORMAL HIGH (ref <18)
Troponin I (High Sensitivity): 30 ng/L — ABNORMAL HIGH (ref <18)

## 2022-03-04 MED ORDER — AMOXICILLIN-POT CLAVULANATE 875-125 MG PO TABS: 1.0000 | ORAL_TABLET | Freq: Two times a day (BID) | ORAL | 0 refills | Status: DC

## 2022-03-04 MED ORDER — AZITHROMYCIN 250 MG PO TABS: 250.0000 mg | ORAL_TABLET | Freq: Every day | ORAL | 0 refills | Status: DC

## 2022-03-04 NOTE — ED Provider Notes (Signed)
[facility] EMERGENCY DEPARTMENT Provider Note   CSN: [id number] Arrival date & time: 03/04/22  1018     History  Chief Complaint  Patient presents with   Abnormal ECG    Yvonne Mcmillan is a 57 y.o. female.  HPI   Patient presents due to feeling short of breath.  It started about a week and a half ago, its only at exertion.  She denies any chest pain, she has had a nonproductive cough that started a few days ago.  No fevers at home, seen by her primary today and had EKG which abnormal T wave changes so sent to ED for further evaluation.  Patient denies chest pain, lower extremity pain or swelling, previous history of MI.  Home Medications Prior to Admission medications   Medication Sig Start Date End Date Taking? Authorizing Provider  amoxicillin-clavulanate (AUGMENTIN) 875-125 MG tablet Take 1 tablet by mouth every 12 (twelve) hours. 03/04/22  Yes Theron Arista, PA-C  azithromycin (ZITHROMAX) 250 MG tablet Take 1 tablet (250 mg total) by mouth daily. Take first 2 tablets together, then 1 every day until finished. 03/04/22  Yes Theron Arista, PA-C  benzonatate (TESSALON PERLES) 100 MG capsule Take 1 capsule (100 mg total) by mouth 3 (three) times daily as needed for cough (cough). 01/13/21   Muthersbaugh, Dahlia Client, PA-C  Fish Oil OIL by Does not apply route daily. 2 gram daily    [provider]  lisinopril-hydrochlorothiazide (PRINZIDE,ZESTORETIC) 20-12.5 MG per tablet Take 1 tablet by mouth daily.    [provider]      Allergies    Metaxalone    Review of Systems   Review of Systems  Physical Exam Updated Vital Signs BP (!) 140/110   Pulse 75   Temp 98.2 F (36.8 C)   Resp 20   Ht 5\' 1"  (1.549 m)   Wt 129.3 kg   SpO2 98%   BMI 53.85 kg/m  Physical Exam Vitals and nursing note reviewed. Exam conducted with a chaperone present.  Constitutional:      Appearance: Normal appearance.  HENT:     Head: Normocephalic and atraumatic.  Eyes:     General: No  scleral icterus.       Right eye: No discharge.        Left eye: No discharge.     Extraocular Movements: Extraocular movements intact.     Pupils: Pupils are equal, round, and reactive to light.  Cardiovascular:     Rate and Rhythm: Normal rate and regular rhythm.     Pulses: Normal pulses.     Heart sounds: Normal heart sounds.     No friction rub. No gallop.  Pulmonary:     Effort: Pulmonary effort is normal. No respiratory distress.     Breath sounds: Normal breath sounds.  Abdominal:     General: Abdomen is flat. Bowel sounds are normal. There is no distension.     Palpations: Abdomen is soft.     Tenderness: There is no abdominal tenderness.  Musculoskeletal:     Right lower leg: No edema.     Left lower leg: No edema.  Skin:    General: Skin is warm and dry.     Coloration: Skin is not jaundiced.  Neurological:     Mental Status: She is alert. Mental status is at baseline.     Coordination: Coordination normal.     ED Results / Procedures / Treatments   Labs (all labs ordered are listed, but only  abnormal results are displayed) Labs Reviewed  BASIC METABOLIC PANEL - Abnormal; Notable for the following components:      Result Value   Potassium 3.0 (*)    Glucose, Bld 166 (*)    Calcium 8.8 (*)    All other components within normal limits  TROPONIN I (HIGH SENSITIVITY) - Abnormal; Notable for the following components:   Troponin I (High Sensitivity) 23 (*)    All other components within normal limits  TROPONIN I (HIGH SENSITIVITY) - Abnormal; Notable for the following components:   Troponin I (High Sensitivity) 30 (*)    All other components within normal limits  CBC WITH DIFFERENTIAL/PLATELET    EKG None  Radiology DG Chest 2 View  Result Date: 03/04/2022 CLINICAL DATA:  Chest pain, dyspnea on exertion, cough EXAM: CHEST - 2 VIEW COMPARISON:  01/13/2021 FINDINGS: Transverse diameter of heart is slightly increased. Increased markings are seen in left lower  lung field. There is blunting of left lateral CP angle. There is no pneumothorax. IMPRESSION: Increased markings in left lower lung fields suggest subsegmental atelectasis/pneumonia. Small left pleural effusion. Electronically Signed   By: Elmer Picker M.D.   On: 03/04/2022 11:34    Procedures Procedures    Medications Ordered in ED Medications - No data to display  ED Course/ Medical Decision Making/ A&P                            Medical Decision Making Amount and/or Complexity of Data Reviewed Labs: ordered. Radiology: ordered. Decision-making details documented in ED Course.  Risk Prescription drug management.   Patient presents to the emergency department due to shortness of breath.  Differential includes but limited to ACS, PE, pneumonia, metabolic abnormality, dissection, AAA.  Patient's symptoms with exertional shortness of breath could be anginal equivalent.  She does have new T wave inversions as documented by primary care doctor and confirmed on initial EKG upon arrival.  Will check cardiac labs, plain film and keep her on cardiac monitoring.  She is currently in sinus rhythm per my interpretation.  Given upper extremity pulses are 2+ symmetric bilaterally and there is no chest pain or back pain I do not think this is a dissection.  Lungs are also clear to auscultation she is not hypoxic or tachycardic I think PE is less likely.  I ordered, viewed and interpreted laboratory workup. CBC without leukocytosis or anemia. BMP without gross electrolyte derangement, mildly hypokalemic with a potassium of 3. Troponin 23---30  Plain film is notable for left lower lobe atelectasis versus pneumonia.  Given productive cough and shortness of breath I do think pneumonia is likely.  EKG shows sinus rhythm with T wave inversions.  Patient on cardiac monitoring in sinus rhythm from interpretation. Given patient has new T wave changes on EKG and elevated troponin at 23-->30.  I  consulted cardiology and spoke with Dr. Dellia Cloud who will evaluate the patient.   Per cardiology recommendations given flat troponins I do not think inpatient cardiology evaluation is indicated for the patient.  They will do an outpatient stress test with the patient.  Appreciate their consult.  Will cover with antibiotic for infection, patient will follow-up outpatient with cardiology.  Return precaution discussed with patient who verbalized understanding and agreement with the plan.        Final Clinical Impression(s) / ED Diagnoses Final diagnoses:  Community acquired pneumonia of left lung, unspecified part of lung  Abnormal EKG  Rx / DC Orders ED Discharge Orders          Ordered    azithromycin (ZITHROMAX) 250 MG tablet  Daily        03/04/22 1723    amoxicillin-clavulanate (AUGMENTIN) 875-125 MG tablet  Every 12 hours        03/04/22 1723    Ambulatory referral to Cardiology        03/04/22 1724              Sherrill Raring, PA-C 03/04/22 1745    Milton Ferguson, MD 03/05/22 1020

## 2022-03-04 NOTE — Consult Note (Addendum)
CARDIOLOGY CONSULT NOTE    Patient ID: Yvonne Mcmillan; [id number]; 01-May-1965   Admit date: 03/04/2022 Date of Consult: 03/04/2022  Primary Care Provider: Avis Epley, PA-C Primary Cardiologist: None Primary Electrophysiologist:     Patient Profile:   Yvonne Mcmillan is a 57 y.o. female with a hx  DM 2, OSA not on CPAP, HLD of  who is being seen today for the evaluation of shortness of breath at the request of Dr. Estell Harpin.  History of Present Illness:   Yvonne Mcmillan is a 57 year old F known to have DM 2, HLD, OSA not on CPAP was sent from the primary care clinic for abnormal EKG changes. EKG showed T wave inversions in lateral leads (I and aVL), Q waves in inferior leads and nonspecific T wave inversion changes in the anterolateral leads.  Patient reported having SOB towards the end of her shift for the last 2 weeks but has no SOB at work while walking.  She thinks she might be feeling tired at the end of her shift and might be feeling short of breath.  Denied any leg swelling. Denied any angina, syncope, lightheadedness, dizziness, palpitations. She has OSA but not on CPAP due to suffocation feeling. No prior history of MI/CAD/CABG.  Past Medical History:  Diagnosis Date   Diabetes mellitus without complication (HCC)    t2   Hyperlipemia    Hypertension     Past Surgical History:  Procedure Laterality Date   ABDOMINAL HYSTERECTOMY  2007   CHOLECYSTECTOMY  2009       Inpatient Medications: Scheduled Meds:  Continuous Infusions:  PRN Meds:   Allergies:    Allergies  Allergen Reactions   Metaxalone Itching    Social History:   Social History   Socioeconomic History   Marital status: Single    Spouse name: Not on file   Number of children: 0   Years of education: college   Highest education level: Not on file  Occupational History    Comment: [employer]   Tobacco Use   Smoking status: Never   Smokeless tobacco: Never  Substance and Sexual Activity    Alcohol use: No    Alcohol/week: 0.0 standard drinks of alcohol   Drug use: No   Sexual activity: Not on file  Other Topics Concern   Not on file  Social History Narrative   Patient consumes no caffeine   Social Determinants of Health   Financial Resource Strain: Not on file  Food Insecurity: Not on file  Transportation Needs: Not on file  Physical Activity: Not on file  Stress: Not on file  Social Connections: Not on file  Intimate Partner Violence: Not on file    Family History:    Family History  Adopted: Yes  Family history unknown: Yes     ROS:  Please see the history of present illness.  ROS  All other ROS reviewed and negative.     Physical Exam/Data:   Vitals:   03/04/22 1330 03/04/22 1430 03/04/22 1530 03/04/22 1630  BP: (!) 158/98 (!) 150/96 (!) 163/93 (!) 158/87  Pulse: 78 85 80 84  Resp: 18 (!) 24 (!) 27 14  Temp:      TempSrc:      SpO2: 100% 98% 99% 97%  Weight:      Height:       No intake or output data in the 24 hours ending 03/04/22 1719 Filed Weights   03/04/22 1127  Weight: 129.3 kg  Body mass index is 53.85 kg/m.  General:  Well nourished, well developed, in no acute distress HEENT: normal Lymph: no adenopathy Neck: no JVD Endocrine:  No thryomegaly Vascular: No carotid bruits; FA pulses 2+ bilaterally without bruits  Cardiac:  normal S1, S2; RRR; no murmur  Lungs:  clear to auscultation bilaterally, no wheezing, rhonchi or rales  Abd: soft, nontender, no hepatomegaly  Ext: no edema Musculoskeletal:  No deformities, BUE and BLE strength normal and equal Skin: warm and dry  Neuro:  CNs 2-12 intact, no focal abnormalities noted Psych:  Normal affect   EKG:  The EKG was personally reviewed and demonstrates:  EKG showed T wave inversions in lateral leads (I and aVL), Q waves in inferior leads and nonspecific changes in the anterolateral leads.   Telemetry:  Telemetry was personally reviewed and demonstrates: Normal sinus  rhythm  Relevant CV Studies None  Laboratory Data:  Chemistry Recent Labs  Lab 03/04/22 1130  NA 142  K 3.0*  CL 109  CO2 23  GLUCOSE 166*  BUN 15  CREATININE 0.97  CALCIUM 8.8*  GFRNONAA >60  ANIONGAP 10    No results for input(s): "PROT", "ALBUMIN", "AST", "ALT", "ALKPHOS", "BILITOT" in the last 168 hours. Hematology Recent Labs  Lab 03/04/22 1130  WBC 9.2  RBC 4.39  HGB 13.9  HCT 42.6  MCV 97.0  MCH 31.7  MCHC 32.6  RDW 13.8  PLT 288   Cardiac EnzymesNo results for input(s): "TROPONINI" in the last 168 hours. No results for input(s): "TROPIPOC" in the last 168 hours.  BNPNo results for input(s): "BNP", "PROBNP" in the last 168 hours.  DDimer No results for input(s): "DDIMER" in the last 168 hours.  Radiology/Studies:  DG Chest 2 View  Result Date: 03/04/2022 CLINICAL DATA:  Chest pain, dyspnea on exertion, cough EXAM: CHEST - 2 VIEW COMPARISON:  01/13/2021 FINDINGS: Transverse diameter of heart is slightly increased. Increased markings are seen in left lower lung field. There is blunting of left lateral CP angle. There is no pneumothorax. IMPRESSION: Increased markings in left lower lung fields suggest subsegmental atelectasis/pneumonia. Small left pleural effusion. Electronically Signed   By: Elmer Picker M.D.   On: 03/04/2022 11:34    Assessment and Plan:   Patient is a 57 year old F known to have  DM 2, HLD, OSA not on CPAP was sent from the PCP clinic due to abnormal EKG changes.  # Abnormal EKG # SOB - EKG showed T wave inversions in lateral leads (I and aVL), Q waves in inferior leads and nonspecific T wave inversion changes in the anterolateral leads. No prior EKG to compare with. She has been having shortness of breath for the last 2 weeks but occurs towards the end of her shift and not during her shift. She denied having any symptoms while walking on her shift. This does not sound ischemic in etiology although her troponins are very minimally  elevated. Due to risk factors of DM 2, HLD, OSA, obtain 2D echocardiogram and treadmill nuclear stress test as an outpatient. Cardiology outpatient follow-up.  # Elevated blood-pressure reading without diagnosis of HTN -Patient does not check her blood pressure at home and has no formal diagnosis of HTN.  Her blood pressures have been normal during her PCP clinic visit but elevated usually when she visits hospitals.  Instructed her to check blood pressures every day twice a day, a.m. and p.m.  She voiced understanding.  She says she wants to follow-up with her PCP regarding  elevated blood pressure management.  I have spent a total of 40 minutes with patient reviewing chart , telemetry, EKGs, labs and examining patient as well as establishing an assessment and plan that was discussed with the patient.  > 50% of time was spent in direct patient care.     For questions or updates, please contact [facility] Please consult [email or web address] for contact info under Cardiology/STEMI.   Signed, Vangie Bicker, MD 03/04/2022 5:19 PM

## 2022-03-04 NOTE — ED Triage Notes (Signed)
Pt coming from PCP with c/o cough, SOB with exertion x 1 week. EKG was done at the clinic and was sent over here for "abnormal EKG". Denies chest pain.

## 2022-03-04 NOTE — Discharge Instructions (Signed)
Take the antibiotics as prescribed.  Follow-up with cardiology in the next week, they should call schedule appointment if you do not hear from them in the next any 2 hours you should call them yourself.  Follow-up with your doctor later in early week for reevaluation.

## 2022-03-04 NOTE — ED Notes (Signed)
Pt assisted to BR.

## 2022-04-21 ENCOUNTER — Ambulatory Visit: Payer: BC Managed Care – PPO | Admitting: Internal Medicine

## 2022-06-01 ENCOUNTER — Ambulatory Visit: Payer: BC Managed Care – PPO | Attending: Internal Medicine | Admitting: Internal Medicine

## 2022-06-01 ENCOUNTER — Encounter: Payer: Self-pay | Admitting: Internal Medicine

## 2022-06-01 VITALS — BP 142/88 | HR 88 | Ht 62.0 in | Wt 282.8 lb

## 2022-06-01 DIAGNOSIS — G4733 Obstructive sleep apnea (adult) (pediatric): Secondary | ICD-10-CM

## 2022-06-01 DIAGNOSIS — I517 Cardiomegaly: Secondary | ICD-10-CM | POA: Insufficient documentation

## 2022-06-01 DIAGNOSIS — I10 Essential (primary) hypertension: Secondary | ICD-10-CM | POA: Insufficient documentation

## 2022-06-01 NOTE — Patient Instructions (Signed)
Medication Instructions:  Your physician recommends that you continue on your current medications as directed. Please refer to the Current Medication list given to you today.  *If you need a refill on your cardiac medications before your next appointment, please call your pharmacy*   Lab Work: None If you have labs (blood work) drawn today and your tests are completely normal, you will receive your results only by: MyChart Message (if you have MyChart) OR A paper copy in the mail If you have any lab test that is abnormal or we need to change your treatment, we will call you to review the results.   Testing/Procedures: Your physician has requested that you have an echocardiogram. Echocardiography is a painless test that uses sound waves to create images of your heart. It provides your doctor with information about the size and shape of your heart and how well your heart's chambers and valves are working. This procedure takes approximately one hour. There are no restrictions for this procedure. Please do NOT wear cologne, perfume, aftershave, or lotions (deodorant is allowed). Please arrive 15 minutes prior to your appointment time.     Follow-Up: At [facility], you and your health needs are our priority.  As part of our continuing mission to provide you with exceptional heart care, we have created designated Provider Care Teams.  These Care Teams include your primary Cardiologist (physician) and Advanced Practice Providers (APPs -  Physician Assistants and Nurse Practitioners) who all work together to provide you with the care you need, when you need it.  We recommend signing up for the patient portal called "MyChart".  Sign up information is provided on this After Visit Summary.  MyChart is used to connect with patients for Virtual Visits (Telemedicine).  Patients are able to view lab/test results, encounter notes, upcoming appointments, etc.  Non-urgent messages can be sent to  your provider as well.   To learn more about what you can do with MyChart, go to [email or web address].    Your next appointment:   Follow up is pending testing results.   Other Instructions

## 2022-06-01 NOTE — Progress Notes (Signed)
Cardiology Office Note  Date: 06/01/2022   ID: Yvonne Mcmillan, DOB 27-Feb-1965, MRN [id number]  PCP:  Avis Epley, PA-C  Cardiologist:  None Electrophysiologist:  None   Reason for Office Visit: Evaluation of abnormal EKG at the request of [facility]   History of Present Illness: Yvonne Mcmillan is a 57 y.o. female known to have HTN, DM2, HLD, OSA not on CPAP was referred to cardiology clinic for evaluation of abnormal EKG.  I personally reviewed the EKG which showed LVH with repolarization abnormality and poor R wave progression which is likely secondary to LVH/RVH. Patient was admitted to an outside hospital 2 years ago when she was diagnosed with diverticulitis and was taken off antihypertensive medications at the time of discharge. She was off antihypertensive medications since then. She does not check her blood pressure at home and today in the clinic it is mildly elevated, 142/88 mmHg. She has no symptoms of angina, DOE, dizziness, syncope, palpitations or leg swelling. Denies smoking cigarettes, alcohol use and illicit drug abuse. No family history of premature ASCVD.  Patient was diagnosed with OSA more than 10 years ago and she did not want to get additional sleep study due to higher co-pay.  She works third shift and sleeps in the day.  Past Medical History:  Diagnosis Date   Diabetes mellitus without complication    t2   Hyperlipemia    Hypertension     Past Surgical History:  Procedure Laterality Date   ABDOMINAL HYSTERECTOMY  2007   CHOLECYSTECTOMY  2009    Current Outpatient Medications  Medication Sig Dispense Refill   glipiZIDE (GLUCOTROL) 5 MG tablet Take 5 mg by mouth 2 (two) times daily.     metFORMIN (GLUCOPHAGE) 1000 MG tablet Take 1,000 mg by mouth 2 (two) times daily with a meal.     pantoprazole (PROTONIX) 40 MG tablet Take 40 mg by mouth daily.     amoxicillin-clavulanate (AUGMENTIN) 875-125 MG tablet Take 1 tablet by mouth every 12 (twelve) hours.  (Patient not taking: Reported on 06/01/2022) 14 tablet 0   azithromycin (ZITHROMAX) 250 MG tablet Take 1 tablet (250 mg total) by mouth daily. Take first 2 tablets together, then 1 every day until finished. (Patient not taking: Reported on 06/01/2022) 6 tablet 0   benzonatate (TESSALON PERLES) 100 MG capsule Take 1 capsule (100 mg total) by mouth 3 (three) times daily as needed for cough (cough). (Patient not taking: Reported on 06/01/2022) 20 capsule 0   Fish Oil OIL by Does not apply route daily. 2 gram daily (Patient not taking: Reported on 06/01/2022)     lisinopril-hydrochlorothiazide (PRINZIDE,ZESTORETIC) 20-12.5 MG per tablet Take 1 tablet by mouth daily. (Patient not taking: Reported on 06/01/2022)     No current facility-administered medications for this visit.   Allergies:  Cefdinir and Metaxalone   Social History: The patient  reports that she has never smoked. She has never used smokeless tobacco. She reports that she does not drink alcohol and does not use drugs.   Family History: The patient's She was adopted. Family history is unknown by patient.   ROS:  Please see the history of present illness. Otherwise, complete review of systems is positive for none.  All other systems are reviewed and negative.   Physical Exam: VS:  BP (!) 142/88   Pulse 88   Ht 5\' 2"  (1.575 m)   Wt 282 lb 12.8 oz (128.3 kg)   SpO2 98%   BMI 51.72 kg/m , BMI  Body mass index is 51.72 kg/m.  Wt Readings from Last 3 Encounters:  06/01/22 282 lb 12.8 oz (128.3 kg)  03/04/22 285 lb (129.3 kg)  01/13/21 255 lb (115.7 kg)    General: Patient appears comfortable at rest. HEENT: Conjunctiva and lids normal, oropharynx clear with moist mucosa. Neck: Supple, no elevated JVP or carotid bruits, no thyromegaly. Lungs: Clear to auscultation, nonlabored breathing at rest. Cardiac: Regular rate and rhythm, no S3 or significant systolic murmur, no pericardial rub. Abdomen: Soft, nontender, no hepatomegaly, bowel  sounds present, no guarding or rebound. Extremities: No pitting edema, distal pulses 2+. Skin: Warm and dry. Musculoskeletal: No kyphosis. Neuropsychiatric: Alert and oriented x3, affect grossly appropriate.  ECG: NSR, LVH with repolarization abnormality  Recent Labwork: 03/04/2022: BUN 15; Creatinine, Ser 0.97; Hemoglobin 13.9; Platelets 288; Potassium 3.0; Sodium 142  No results found for: "CHOL", "TRIG", "HDL", "CHOLHDL", "VLDL", "LDLCALC", "LDLDIRECT"  Other Studies Reviewed Today:   Assessment and Plan: Patient is a 57 year old F known to have HTN, DM 2, HLD, OSA not on CPAP was referred to cardiology clinic for evaluation of abnormal EKG.  # Abnormal EKG: I personally reviewed the EKG which showed NSR, LVH with repolarization abnormality and poor R wave progression which could represent LVH/RVH. Will obtain 2D echocardiogram to rule out severe LVH. I instructed patient to check her blood pressures every day in the morning and evening, at bedtime.  She will have to follow-up with PCP for management of HTN. # HTN: Was taken off antihypertensive medications when she was hospitalized 2 years ago. Check blood pressures at home in a.m. and p.m.  Follow-up with PCP for further management of HTN. # OSA not on CPAP: Patient was diagnosed with OSA more than 10 years ago and she will need a new sleep study for OSA diagnosis. She prefers home sleep study compared to lab sleep study.   I have spent a total of 45 minutes with patient reviewing chart, EKGs, labs and examining patient as well as establishing an assessment and plan that was discussed with the patient.  > 50% of time was spent in direct patient care.     Medication Adjustments/Labs and Tests Ordered: Current medicines are reviewed at length with the patient today.  Concerns regarding medicines are outlined above.   Tests Ordered: Orders Placed This Encounter  Procedures   ECHOCARDIOGRAM COMPLETE   [facility] Sleep Study      Medication Changes: No orders of the defined types were placed in this encounter.   Disposition:  Follow up  pending results  Signed, Denese Mentink Verne Spurr, MD, 06/01/2022 8:15 AM    [facility] at [facility] [address] [place], [place] [address]

## 2022-06-01 NOTE — Progress Notes (Signed)
Sleep Apnea Evaluation  [facility]  Today's Date: 06/01/2022   Patient Name: Yvonne Mcmillan        DOB: 08-21-1965       Height:  5\' 2"  (1.575 m)     Weight: 282 lb 12.8 oz (128.3 kg)  BMI: Body mass index is 51.72 kg/m.    Referring Provider:  Luane School, MD   STOP-BANG RISK ASSESSMENT         If STOP-BANG Score ?3 OR two clinical symptoms - patient qualifies for WatchPAT (CPT 95800)      Sleep study ordered due to two (2) of the following clinical symptoms/diagnoses:  Excessive daytime sleepiness G47.10  Gastroesophageal reflux K21.9  Nocturia R35.1  Morning Headaches G44.221  Difficulty concentrating R41.840  Memory problems or poor judgment G31.84  Personality changes or irritability R45.4  Loud snoring R06.83  Depression F32.9  Unrefreshed by sleep G47.8  Impotence N52.9  History of high blood pressure R03.0  Insomnia G47.00  Sleep Disordered Breathing or Sleep Apnea ICD G47.33

## 2022-06-08 ENCOUNTER — Telehealth: Payer: Self-pay | Admitting: *Deleted

## 2022-06-08 NOTE — Telephone Encounter (Signed)
Prior Authorization for ITAMAR sent to [employer] via web portal. Tracking Number .  READY- NO PA REQ 

## 2022-06-09 NOTE — Telephone Encounter (Signed)
Patient notified of device approval and provided pin number for use. Patient had no further questions or concerns at this time.

## 2022-06-22 ENCOUNTER — Encounter: Payer: Self-pay | Admitting: Cardiology

## 2022-06-24 NOTE — Procedures (Signed)
Erroneous encounter

## 2022-07-04 ENCOUNTER — Ambulatory Visit (HOSPITAL_COMMUNITY): Payer: BC Managed Care – PPO

## 2022-07-04 ENCOUNTER — Ambulatory Visit: Payer: BC Managed Care – PPO | Attending: Internal Medicine

## 2022-07-04 DIAGNOSIS — G4733 Obstructive sleep apnea (adult) (pediatric): Secondary | ICD-10-CM

## 2022-07-04 NOTE — Progress Notes (Signed)
This encounter was created in error - please disregard.

## 2022-07-06 ENCOUNTER — Telehealth: Payer: Self-pay | Admitting: *Deleted

## 2022-07-06 DIAGNOSIS — G4733 Obstructive sleep apnea (adult) (pediatric): Secondary | ICD-10-CM

## 2022-07-06 DIAGNOSIS — I517 Cardiomegaly: Secondary | ICD-10-CM

## 2022-07-06 DIAGNOSIS — I10 Essential (primary) hypertension: Secondary | ICD-10-CM

## 2022-07-06 NOTE — Telephone Encounter (Signed)
-----   Message from Quintella Reichert, MD sent at 07/04/2022 11:49 AM EDT ----- Patient needs to repeat Itamar sleep study due to poor tracings and needs to make sure her finger is secure

## 2022-07-06 NOTE — Addendum Note (Signed)
Addended by: Reesa Chew on: 07/06/2022 05:57 PM   Modules accepted: Orders

## 2022-07-06 NOTE — Telephone Encounter (Signed)
The patient has been notified of the result. Left detailed message on voicemail and informed patient to call back..Trinady Milewski Green, CMA   

## 2022-07-07 NOTE — Telephone Encounter (Signed)
The patient has been notified of the result. Left detailed message on voicemail and informed patient to call back..Beauregard Jarrells Green, CMA   

## 2022-07-21 NOTE — Telephone Encounter (Signed)
Prior Authorization for [facility] sent to [facility] via web portal. Tracking Number . READY NO PA REQ-REF#[id number]

## 2022-07-22 NOTE — Telephone Encounter (Signed)
Called to inform patient of activation and patient stated that she needed a new device as hers previously did not read her study.

## 2022-07-22 NOTE — Telephone Encounter (Signed)
Left a message for patient to call office back regarding Itamar authorization.   PA- None needed for device Pin # for activation: [id number]

## 2022-07-29 ENCOUNTER — Ambulatory Visit (HOSPITAL_COMMUNITY)
Admission: RE | Admit: 2022-07-29 | Discharge: 2022-07-29 | Disposition: A | Discharge status: Home / Self Care | Payer: BC Managed Care – PPO | Source: Ambulatory Visit | Attending: Internal Medicine | Admitting: Internal Medicine

## 2022-07-29 DIAGNOSIS — I517 Cardiomegaly: Secondary | ICD-10-CM | POA: Diagnosis present

## 2022-07-29 LAB — ECHOCARDIOGRAM COMPLETE
Area-P 1/2: 7.16 cm2
S' Lateral: 3.2 cm

## 2022-07-29 NOTE — Progress Notes (Signed)
*  PRELIMINARY RESULTS* Echocardiogram 2D Echocardiogram has been performed.  Stacey Drain 07/29/2022, 11:39 AM

## 2022-08-01 ENCOUNTER — Telehealth: Payer: Self-pay

## 2022-08-01 MED ORDER — METOPROLOL TARTRATE 25 MG PO TABS: ORAL_TABLET | ORAL | 3 refills | Status: DC

## 2022-08-01 NOTE — Telephone Encounter (Signed)
Results discussed, pt is back on lisinopril only-not home,doesn't know dose  Will start lopressor 12.5 mg bid and f/u in 3 months

## 2022-08-01 NOTE — Telephone Encounter (Signed)
-----   Message from Marjo Bicker, MD sent at 08/01/2022 12:47 PM EDT ----- Echo showed LVEF 45 to 50% and no valvular abnormalities. Patient has no symptoms of DOE or angina. Will need to start GDMT only if BP> 90 mmHg as she was taken off antihypertensive medications recently. Start metoprolol tartarate 12.5 mg twice daily (hold if BP < 90 mmHg). Schedule follow-up in 3 months.

## 2022-10-09 ENCOUNTER — Encounter (INDEPENDENT_AMBULATORY_CARE_PROVIDER_SITE_OTHER): Payer: BC Managed Care – PPO | Admitting: Cardiology

## 2022-10-09 DIAGNOSIS — G4731 Primary central sleep apnea: Secondary | ICD-10-CM

## 2022-10-09 DIAGNOSIS — G4733 Obstructive sleep apnea (adult) (pediatric): Secondary | ICD-10-CM

## 2022-10-10 ENCOUNTER — Ambulatory Visit: Payer: BC Managed Care – PPO | Attending: Internal Medicine

## 2022-10-10 DIAGNOSIS — I517 Cardiomegaly: Secondary | ICD-10-CM

## 2022-10-10 DIAGNOSIS — I10 Essential (primary) hypertension: Secondary | ICD-10-CM

## 2022-10-10 DIAGNOSIS — G4733 Obstructive sleep apnea (adult) (pediatric): Secondary | ICD-10-CM

## 2022-10-10 NOTE — Procedures (Signed)
SLEEP STUDY REPORT Patient Information Study Date: 10/09/2022 Patient Name: Yvonne Mcmillan Patient ID: [id number] Birth Date: 1965/05/31 Age: 57 Gender: Female BMI: 51.9 (W=282 lb, H=5' 2'') Stopbang: 6 Referring Physician: Luane School, MD  TEST DESCRIPTION: Home sleep apnea testing was completed using the WatchPat, a Type 1 device, utilizing  peripheral arterial tonometry (PAT), chest movement, actigraphy, pulse oximetry, pulse rate, body position and snore.  AHI was calculated with apnea and hypopnea using valid sleep time as the denominator. RDI includes apneas,  hypopneas, and RERAs. The data acquired and the scoring of sleep and all associated events were performed in  accordance with the recommended standards and specifications as outlined in the AASM Manual for the Scoring of  Sleep and Associated Events 2.2.0 (2015).  FINDINGS:  1. Severe Obstructive Sleep Apnea with AHI 65.6/hr.   2. Moderate Central Sleep Apnea with pAHIc 17.8/hr with 21% of time with [facility].  3. Oxygen desaturations as low as 81%.  4. Severe snoring was present. O2 sats were < 88% for 7.3 min.  5. Total sleep time was 3 hrs and 48 min.  6. 22.8% of total sleep time was spent in REM sleep.   7. Normal sleep onset latency at 13 min.   8. Prolonged REM sleep onset latency at 155 min.   9. Total awakenings were 18 .  10. Arrhythmia detection: None  DIAGNOSIS:  Severe Obstructive Sleep Apnea (G47.33) Moderate Central Sleep Apnea Nocturnal Hypoxemia  RECOMMENDATIONS: 1. Clinical correlation of these findings is necessary. The decision to treat obstructive sleep apnea (OSA) is usually  based on the presence of apnea symptoms or the presence of associated medical conditions such as Hypertension,  Congestive Heart Failure, Atrial Fibrillation or Obesity. The most common symptoms of OSA are snoring, gasping for  breath while sleeping, daytime sleepiness and fatigue.   2.  Initiating apnea therapy is recommended given the presence of symptoms and/or associated conditions.  Recommend proceeding with one of the following:   a. Auto-CPAP therapy with a pressure range of 5-20cm H2O.   b. An oral appliance (OA) that can be obtained from certain dentists with expertise in sleep medicine. These are  primarily of use in non-obese patients with mild and moderate disease.   c. An ENT consultation which may be useful to look for specific causes of obstruction and possible treatment  options.  d. If patient is intolerant to PAP therapy, consider referral to ENT for evaluation for hypoglossal nerve stimulator.   3. Close follow-up is necessary to ensure success with CPAP or oral appliance therapy for maximum benefit .  4. A follow-up oximetry study on CPAP is recommended to assess the adequacy of therapy and determine the need  for supplemental oxygen or the potential need for Bi-level therapy. An arterial blood gas to determine the adequacy of  baseline ventilation and oxygenation should also be considered.  5. Healthy sleep recommendations include: adequate nightly sleep (normal 7-9 hrs/night), avoidance of caffeine after  noon and alcohol near bedtime, and maintaining a sleep environment that is cool, dark and quiet.  6. Weight loss for overweight patients is recommended. Even modest amounts of weight loss can significantly  improve the severity of sleep apnea.  7. Snoring recommendations include: weight loss where appropriate, side sleeping, and avoidance of alcohol before  bed.  8. Operation of motor vehicle should be avoided when sleepy.  Signature: Armanda Magic, MD; [facility]; Diplomat, American Board of Sleep  Medicine Electronically Signed: 10/10/2022 7:82:95  P

## 2022-10-11 ENCOUNTER — Telehealth: Payer: Self-pay

## 2022-10-11 DIAGNOSIS — I10 Essential (primary) hypertension: Secondary | ICD-10-CM

## 2022-10-11 DIAGNOSIS — G4731 Primary central sleep apnea: Secondary | ICD-10-CM

## 2022-10-11 DIAGNOSIS — G4733 Obstructive sleep apnea (adult) (pediatric): Secondary | ICD-10-CM

## 2022-10-11 NOTE — Telephone Encounter (Signed)
-----   Message from Armanda Magic sent at 10/10/2022  2:09 PM EDT ----- Please let patient know that they have sleep apnea.  Recommend therapeutic CPAP titration for treatment of patient's sleep disordered breathing.  If unable to perform an in lab titration then initiate ResMed auto CPAP from 4 to 15cm H2O with heated humidity and mask of choice and overnight pulse ox on CPAP.

## 2022-10-11 NOTE — Telephone Encounter (Signed)
 Left VM, per DPR, with callback number for sleep study results and recommendations.

## 2022-10-27 ENCOUNTER — Telehealth: Payer: Self-pay | Admitting: Internal Medicine

## 2022-10-27 NOTE — Telephone Encounter (Signed)
Pt would like a callback regarding CPAP machine. She states that she was told that she would hear from the company regarding machine. She says that it is going on 3 weeks and has yet to hear anything. Please advise

## 2022-11-02 NOTE — Telephone Encounter (Signed)
Patient is calling to follow up. Please advise.

## 2022-11-07 NOTE — Telephone Encounter (Signed)
Patient is following up due to not hearing back from anyone. Please advise.

## 2022-11-09 NOTE — Telephone Encounter (Signed)
Prior Authorization for titration sent to [facility] [employer] via web portal. Tracking Number .  NO PA REQ-REF #[id number]/23/22

## 2022-11-09 NOTE — Addendum Note (Signed)
Addended by: Reesa Chew on: 11/09/2022 01:39 PM   Modules accepted: Orders

## 2022-11-16 ENCOUNTER — Ambulatory Visit: Payer: BC Managed Care – PPO | Attending: Internal Medicine | Admitting: Internal Medicine

## 2022-11-16 ENCOUNTER — Encounter: Payer: Self-pay | Admitting: Internal Medicine

## 2022-11-16 VITALS — BP 138/90 | HR 80 | Ht 61.0 in | Wt 277.4 lb

## 2022-11-16 DIAGNOSIS — I429 Cardiomyopathy, unspecified: Secondary | ICD-10-CM | POA: Diagnosis not present

## 2022-11-16 DIAGNOSIS — Z79899 Other long term (current) drug therapy: Secondary | ICD-10-CM | POA: Diagnosis not present

## 2022-11-16 DIAGNOSIS — I5022 Chronic systolic (congestive) heart failure: Secondary | ICD-10-CM | POA: Diagnosis not present

## 2022-11-16 DIAGNOSIS — I517 Cardiomegaly: Secondary | ICD-10-CM

## 2022-11-16 MED ORDER — METOPROLOL TARTRATE 25 MG PO TABS: 25.0000 mg | ORAL_TABLET | Freq: Two times a day (BID) | ORAL | 3 refills | Status: AC

## 2022-11-16 MED ORDER — EMPAGLIFLOZIN 10 MG PO TABS: 10.0000 mg | ORAL_TABLET | Freq: Every day | ORAL | 11 refills | Status: DC

## 2022-11-16 NOTE — Progress Notes (Signed)
Cardiology Office Note  Date: 11/16/2022   ID: Yvonne Mcmillan, DOB [birth year], MRN [id number]  PCP:  Avis Epley, PA-C  Cardiologist:  Marjo Bicker, MD Electrophysiologist:  None    History of Present Illness: Yvonne Mcmillan is a 57 y.o. female known to have new onset cardiomyopathy LVEF 45 to 50%, HTN, DM2, HLD, OSA not on CPAP is here for follow-up visit.  Patient upset about not hearing back from the sleep clinic.  Diagnosed with severe obstructive sleep apnea and moderate central sleep apnea.  Awaiting CPAP.  Asymptomatic, overall doing great except for SOB with severe exertion.  No angina, dizziness, presyncope, syncope, palpitations, leg swelling.  Past Medical History:  Diagnosis Date   Diabetes mellitus without complication (HCC)    t2   Hyperlipemia    Hypertension     Past Surgical History:  Procedure Laterality Date   ABDOMINAL HYSTERECTOMY  2007   CHOLECYSTECTOMY  2009    Current Outpatient Medications  Medication Sig Dispense Refill   atorvastatin (LIPITOR) 20 MG tablet Take 20 mg by mouth daily.     empagliflozin (JARDIANCE) 10 MG TABS tablet Take 1 tablet (10 mg total) by mouth daily before breakfast. 30 tablet 11   lisinopril (ZESTRIL) 10 MG tablet Take 10 mg by mouth daily.     metFORMIN (GLUCOPHAGE) 1000 MG tablet Take 1,000 mg by mouth 2 (two) times daily with a meal.     pantoprazole (PROTONIX) 40 MG tablet Take 40 mg by mouth daily.     metoprolol tartrate (LOPRESSOR) 25 MG tablet Take 1 tablet (25 mg total) by mouth 2 (two) times daily. 180 tablet 3   No current facility-administered medications for this visit.   Allergies:  Cefdinir and Metaxalone   Social History: The patient  reports that she has never smoked. She has never used smokeless tobacco. She reports that she does not drink alcohol and does not use drugs.   Family History: The patient's She was adopted. Family history is unknown by patient.   ROS:  Please see the  history of present illness. Otherwise, complete review of systems is positive for none.  All other systems are reviewed and negative.   Physical Exam: VS:  BP (!) 138/90 (BP Location: Left Arm)   Pulse 80   Ht 5\' 1"  (1.549 m)   Wt 277 lb 6.4 oz (125.8 kg)   SpO2 94%   BMI 52.41 kg/m , BMI Body mass index is 52.41 kg/m.  Wt Readings from Last 3 Encounters:  11/16/22 277 lb 6.4 oz (125.8 kg)  06/01/22 282 lb 12.8 oz (128.3 kg)  03/04/22 285 lb (129.3 kg)    General: Patient appears comfortable at rest. HEENT: Conjunctiva and lids normal, oropharynx clear with moist mucosa. Neck: Supple, no elevated JVP or carotid bruits, no thyromegaly. Lungs: Clear to auscultation, nonlabored breathing at rest. Cardiac: Regular rate and rhythm, no S3 or significant systolic murmur, no pericardial rub. Abdomen: Soft, nontender, no hepatomegaly, bowel sounds present, no guarding or rebound. Extremities: No pitting edema, distal pulses 2+. Skin: Warm and dry. Musculoskeletal: No kyphosis. Neuropsychiatric: Alert and oriented x3, affect grossly appropriate.  ECG: NSR, LVH with repolarization abnormality  Recent Labwork: 03/04/2022: BUN 15; Creatinine, Ser 0.97; Hemoglobin 13.9; Platelets 288; Potassium 3.0; Sodium 142  No results found for: "CHOL", "TRIG", "HDL", "CHOLHDL", "VLDL", "LDLCALC", "LDLDIRECT"    Assessment and Plan:  Cardiomyopathy LVEF 45 to 50%, unclear etiology: Asymptomatic, SOB with overexertion and no angina.  Increase metoprolol  tartrate from 12.5 mg to 25 mg twice daily, continue lisinopril 10 mg once daily, start Jardiance 10 mg once daily.  Obtain BMP in 5 days. Not a candidate for stress test or CT cardiac due to reduced specificity from elevated BMI 52.  Will repeat echocardiogram after she starts CPAP therapy and after up titration of GI.  HTN, controlled: Continue above medications.  OSA not on CPAP: Diagnosed with severe OSA and moderate central sleep apnea, awaiting  CPAP, reached out to Dr. French Ana Turner's office regarding CPAP.   Tests Ordered: Orders Placed This Encounter  Procedures   Basic metabolic panel   EKG 12-Lead     Medication Changes: Meds ordered this encounter  Medications   metoprolol tartrate (LOPRESSOR) 25 MG tablet    Sig: Take 1 tablet (25 mg total) by mouth 2 (two) times daily.    Dispense:  180 tablet    Refill:  3   empagliflozin (JARDIANCE) 10 MG TABS tablet    Sig: Take 1 tablet (10 mg total) by mouth daily before breakfast.    Dispense:  30 tablet    Refill:  11    Disposition:  Follow up  6 months  Signed, Alzina Golda Verne Spurr, MD, 11/16/2022 3:08 PM    [facility] at [facility] [address] [place], [place] [address]

## 2022-11-16 NOTE — Patient Instructions (Addendum)
Medication Instructions:  Your physician has recommended you make the following change in your medication:   -Increase Metoprolol to 25 mg twice daily.  -Start Jardiance 10 mg tablet once daily.  *If you need a refill on your cardiac medications before your next appointment, please call your pharmacy*   Lab Work: In 5 Days: -BNP  If you have labs (blood work) drawn today and your tests are completely normal, you will receive your results only by: MyChart Message (if you have MyChart) OR A paper copy in the mail If you have any lab test that is abnormal or we need to change your treatment, we will call you to review the results.   Testing/Procedures: None   Follow-Up: At [facility], you and your health needs are our priority.  As part of our continuing mission to provide you with exceptional heart care, we have created designated Provider Care Teams.  These Care Teams include your primary Cardiologist (physician) and Advanced Practice Providers (APPs -  Physician Assistants and Nurse Practitioners) who all work together to provide you with the care you need, when you need it.  We recommend signing up for the patient portal called "MyChart".  Sign up information is provided on this After Visit Summary.  MyChart is used to connect with patients for Virtual Visits (Telemedicine).  Patients are able to view lab/test results, encounter notes, upcoming appointments, etc.  Non-urgent messages can be sent to your provider as well.   To learn more about what you can do with MyChart, go to [email or web address].    Your next appointment:   6 month(s)  Provider:   You may see Vishnu P Mallipeddi, MD or one of the following Advanced Practice Providers on your designated Care Team:   [place], PA-C  Jacolyn Reedy, [place]     Other Instructions

## 2022-11-17 ENCOUNTER — Telehealth: Payer: Self-pay

## 2022-11-17 ENCOUNTER — Telehealth: Payer: Self-pay | Admitting: Internal Medicine

## 2022-11-17 DIAGNOSIS — G4733 Obstructive sleep apnea (adult) (pediatric): Secondary | ICD-10-CM

## 2022-11-17 NOTE — Telephone Encounter (Signed)
Pt c/o medication issue:  1. Name of Medication: empagliflozin (JARDIANCE) 10 MG TABS tablet   2. How are you currently taking this medication (dosage and times per day)?   3. Are you having a reaction (difficulty breathing--STAT)?   4. What is your medication issue? Patient is requesting call back to discuss this medication and the discount card that was given. She states that it is not working and would like to discuss this further. Please advise.

## 2022-11-17 NOTE — Telephone Encounter (Signed)
Spoke with patient today. CPAP Titration cancelled and order placed for new CPAP machine and supplies through [facility].Patient will reach out once machine and supplies are received.

## 2022-11-17 NOTE — Telephone Encounter (Signed)
Advised pt to call [phone] to activate card. Pt stated she will try this and call back if there are any further issues.

## 2022-11-25 NOTE — Telephone Encounter (Signed)
Spoke with pt. She is still having issues with the Jardiance card. She got it registered and then it would not work at the pharmacy. Read on Jardiance website and advised her to call [phone] that said call this number if you are having trouble with your card. Pt agreed to try this number. She will call back for any further issues.

## 2022-11-25 NOTE — Telephone Encounter (Signed)
Patient stated the card did not work for her to get her Jardiance medication.  Patient wants advice on next steps.

## 2023-01-28 ENCOUNTER — Other Ambulatory Visit: Payer: Self-pay | Admitting: Internal Medicine

## 2023-05-03 ENCOUNTER — Ambulatory Visit: Payer: BC Managed Care – PPO | Admitting: Internal Medicine

## 2023-05-05 ENCOUNTER — Ambulatory Visit: Payer: BC Managed Care – PPO | Admitting: Internal Medicine

## 2023-07-05 ENCOUNTER — Encounter: Payer: Self-pay | Admitting: Internal Medicine

## 2023-07-05 ENCOUNTER — Ambulatory Visit: Attending: Internal Medicine | Admitting: Internal Medicine

## 2023-07-05 VITALS — BP 128/82 | HR 76 | Ht 61.0 in | Wt 279.4 lb

## 2023-07-05 DIAGNOSIS — I429 Cardiomyopathy, unspecified: Secondary | ICD-10-CM | POA: Diagnosis not present

## 2023-07-05 DIAGNOSIS — I517 Cardiomegaly: Secondary | ICD-10-CM

## 2023-07-05 DIAGNOSIS — G4731 Primary central sleep apnea: Secondary | ICD-10-CM

## 2023-07-05 DIAGNOSIS — G4733 Obstructive sleep apnea (adult) (pediatric): Secondary | ICD-10-CM

## 2023-07-05 DIAGNOSIS — I5022 Chronic systolic (congestive) heart failure: Secondary | ICD-10-CM

## 2023-07-05 MED ORDER — SACUBITRIL-VALSARTAN 24-26 MG PO TABS: 1.0000 | ORAL_TABLET | Freq: Two times a day (BID) | ORAL | 11 refills | Status: AC

## 2023-07-05 NOTE — Patient Instructions (Signed)
 Medication Instructions:  Your physician has recommended you make the following change in your medication:   -Stop Lisinopril  -Start Entresto 24-26 mg twice daily- after 3 day washout period from Lisinopril  *If you need a refill on your cardiac medications before your next appointment, please call your pharmacy*  Lab Work: NOne If you have labs (blood work) drawn today and your tests are completely normal, you will receive your results only by: MyChart Message (if you have MyChart) OR A paper copy in the mail If you have any lab test that is abnormal or we need to change your treatment, we will call you to review the results.  Testing/Procedures: Your physician has requested that you have an echocardiogram. Echocardiography is a painless test that uses sound waves to create images of your heart. It provides your doctor with information about the size and shape of your heart and how well your heart's chambers and valves are working. This procedure takes approximately one hour. There are no restrictions for this procedure. Please do NOT wear cologne, perfume, aftershave, or lotions (deodorant is allowed). Please arrive 15 minutes prior to your appointment time.  Please note: We ask at that you not bring children with you during ultrasound (echo/ vascular) testing. Due to room size and safety concerns, children are not allowed in the ultrasound rooms during exams. Our front office staff cannot provide observation of children in our lobby area while testing is being conducted. An adult accompanying a patient to their appointment will only be allowed in the ultrasound room at the discretion of the ultrasound technician under special circumstances. We apologize for any inconvenience.   Follow-Up: At [facility], you and your health needs are our priority.  As part of our continuing mission to provide you with exceptional heart care, our providers are all part of one team.  This team  includes your primary Cardiologist (physician) and Advanced Practice Providers or APPs (Physician Assistants and Nurse Practitioners) who all work together to provide you with the care you need, when you need it.  Your next appointment:   3 month(s)  Provider:   You may see Vishnu P Mallipeddi, MD or one of the following Advanced Practice Providers on your designated Care Team:   [place], PA-C  Scotesia [place], [place] Theotis Flake, [place]     We recommend signing up for the patient portal called "MyChart".  Sign up information is provided on this After Visit Summary.  MyChart is used to connect with patients for Virtual Visits (Telemedicine).  Patients are able to view lab/test results, encounter notes, upcoming appointments, etc.  Non-urgent messages can be sent to your provider as well.   To learn more about what you can do with MyChart, go to [email or web address].   Other Instructions You have been referred to sleep medicine. They will call you with your first appointment.

## 2023-07-05 NOTE — Progress Notes (Signed)
 Cardiology Office Note  Date: 07/05/2023   ID: Yvonne Mcmillan, DOB 09-Oct-1965, MRN [id number]  PCP:  Roxene Cora, PA-C  Cardiologist:  Lasalle Pointer, MD Electrophysiologist:  None    History of Present Illness: Yvonne Mcmillan is a 58 y.o. female known to have new onset cardiomyopathy LVEF 45 to 50%, HTN, DM2, HLD, OSA not on CPAP is here for follow-up visit.  She heard back from Dr. Sherrlyn Dolores Turner's office in September 2024 for CPAP supplies and that was the last time she heard from them.  She was previously upset but now she gave up.  Denies having angina or DOE.  No dizziness, lightheadedness, syncope, leg  Past Medical History:  Diagnosis Date   Diabetes mellitus without complication (HCC)    t2   Hyperlipemia    Hypertension     Past Surgical History:  Procedure Laterality Date   ABDOMINAL HYSTERECTOMY  2007   CHOLECYSTECTOMY  2009    Current Outpatient Medications  Medication Sig Dispense Refill   atorvastatin (LIPITOR) 20 MG tablet Take 20 mg by mouth daily.     lisinopril (ZESTRIL) 10 MG tablet Take 10 mg by mouth daily.     metFORMIN (GLUCOPHAGE) 1000 MG tablet Take 1,000 mg by mouth 2 (two) times daily with a meal.     metoprolol  tartrate (LOPRESSOR ) 25 MG tablet Take 1 tablet (25 mg total) by mouth 2 (two) times daily. 180 tablet 3   pantoprazole (PROTONIX) 40 MG tablet Take 40 mg by mouth daily.     No current facility-administered medications for this visit.   Allergies:  Cefdinir and Metaxalone   Social History: The patient  reports that she has never smoked. She has never used smokeless tobacco. She reports that she does not drink alcohol and does not use drugs.   Family History: The patient's She was adopted. Family history is unknown by patient.   ROS:  Please see the history of present illness. Otherwise, complete review of systems is positive for none.  All other systems are reviewed and negative.   Physical Exam: VS:  Ht 5\' 1"  (1.549  m)   Wt 279 lb 6.4 oz (126.7 kg)   BMI 52.79 kg/m , BMI Body mass index is 52.79 kg/m.  Wt Readings from Last 3 Encounters:  07/05/23 279 lb 6.4 oz (126.7 kg)  11/16/22 277 lb 6.4 oz (125.8 kg)  06/01/22 282 lb 12.8 oz (128.3 kg)    General: Patient appears comfortable at rest. HEENT: Conjunctiva and lids normal, oropharynx clear with moist mucosa. Neck: Supple, no elevated JVP or carotid bruits, no thyromegaly. Lungs: Clear to auscultation, nonlabored breathing at rest. Cardiac: Regular rate and rhythm, no S3 or significant systolic murmur, no pericardial rub. Abdomen: Soft, nontender, no hepatomegaly, bowel sounds present, no guarding or rebound. Extremities: No pitting edema, distal pulses 2+. Skin: Warm and dry. Musculoskeletal: No kyphosis. Neuropsychiatric: Alert and oriented x3, affect grossly appropriate.  ECG: NSR, LVH with repolarization abnormality  Recent Labwork: No results found for requested labs within last 365 days.  No results found for: "CHOL", "TRIG", "HDL", "CHOLHDL", "VLDL", "LDLCALC", "LDLDIRECT"    Assessment and Plan:  Cardiomyopathy LVEF 45 to 50%, unclear etiology: Asymptomatic, no DOE or angina.  Continue metoprolol  tartrate 25 mg twice daily, switch lisinopril to Entresto 24-26 mg twice daily.  Start Entresto after 3 days of discontinuing lisinopril.  Unable to afford Jardiance .  Not a candidate for ischemia evaluation due to severe morbid obesity.  Will repeat  echocardiogram in 6 months before the next clinic visit, hopefully she is on CPAP at that time.  HTN, controlled: Continue above medications.  OSA not on CPAP: Diagnosed with severe OSA and moderate central sleep apnea, awaiting CPAP, did not hear back from Dr. Sherrlyn Dolores Turner's office since September 2024 for CPAP supplies.  She was previously upset about this and now she gave up.  Will refer her to sleep medicine/pulm for OSA management.   Tests Ordered: Orders Placed This Encounter   Procedures   EKG 12-Lead     Medication Changes: No orders of the defined types were placed in this encounter.   Disposition:  Follow up 6 months  Signed, Youcef Klas Beauford Bounds, MD, 07/05/2023 9:57 AM    [facility] at [facility] [address], [place], [place] [address]

## 2023-12-11 ENCOUNTER — Emergency Department (HOSPITAL_COMMUNITY)

## 2023-12-11 ENCOUNTER — Emergency Department (HOSPITAL_COMMUNITY)
Admission: EM | Admit: 2023-12-11 | Discharge: 2023-12-11 | Disposition: A | Discharge status: Home / Self Care | Attending: Emergency Medicine | Admitting: Emergency Medicine

## 2023-12-11 DIAGNOSIS — X501XXA Overexertion from prolonged static or awkward postures, initial encounter: Secondary | ICD-10-CM | POA: Insufficient documentation

## 2023-12-11 DIAGNOSIS — S93601A Unspecified sprain of right foot, initial encounter: Secondary | ICD-10-CM | POA: Insufficient documentation

## 2023-12-11 DIAGNOSIS — S99921A Unspecified injury of right foot, initial encounter: Secondary | ICD-10-CM | POA: Diagnosis present

## 2023-12-11 MED ORDER — HYDROCODONE-ACETAMINOPHEN 5-325 MG PO TABS: 1.0000 | ORAL_TABLET | Freq: Four times a day (QID) | ORAL | 0 refills | Status: AC | PRN

## 2023-12-11 MED ORDER — HYDROCODONE-ACETAMINOPHEN 5-325 MG PO TABS
1.0000 | ORAL_TABLET | Freq: Once | ORAL | Status: AC
  Administered 2023-12-11: 1 via ORAL
  Filled 2023-12-11: qty 1

## 2023-12-11 NOTE — Discharge Instructions (Signed)
 Continue using ice and elevation is much as is comfortable for the next several days to help with pain and swelling in your foot.  Also recommend you wearing the brace given to protect your foot.  I recommend using a cane or a walker to minimize weightbearing until you are more comfortable bearing weight.  You have been prescribed pain medication, this will make you drowsy, do not drive within 4 hours of taking the medication.  I also do recommend you continue using your ibuprofen (advil) taking 3 tablets every 8 hours which will help heal this injured foot.

## 2023-12-11 NOTE — ED Provider Notes (Signed)
 [place] EM[facility] DE[facility]MENT AT [facility] Provider Note   CSN: [id number] Arrival date & time: 12/11/23  8064     Patient presents with: Foot Pain   Yvonne Mcmillan is a 58 y.o. female presenting for evaluation of severe right lateral foot pain and generalized foot swelling since rolling her ankle while ambulating 2 days ago.  She has used ice and elevation, took 600 mg ibuprofen this morning without significant pain relief.  She is on her feet today and as of the day progressed her pain and swelling has worsened.  She denies radiation of pain into her leg or knee and denies any other injuries.   The history is provided by the patient.       Prior to Admission medications   Medication Sig Start Date End Date Taking? Authorizing Provider  HYDROcodone-acetaminophen (NORCO/VICODIN) 5-325 MG tablet Take 1 tablet by mouth every 6 (six) hours as needed for moderate pain (pain score 4-6) or severe pain (pain score 7-10). 12/11/23  Yes Lindsy Cerullo, PA-C  atorvastatin (LIPITOR) 20 MG tablet Take 20 mg by mouth daily. 09/29/22   [provider]  metFORMIN (GLUCOPHAGE) 1000 MG tablet Take 1,000 mg by mouth 2 (two) times daily with a meal. 05/24/22   [provider]  metoprolol  tartrate (LOPRESSOR ) 25 MG tablet Take 1 tablet (25 mg total) by mouth 2 (two) times daily. 11/16/22   Mallipeddi, Vishnu P, MD  pantoprazole (PROTONIX) 40 MG tablet Take 40 mg by mouth daily. 08/25/20   [provider]  sacubitril -valsartan  (ENTRESTO ) 24-26 MG Take 1 tablet by mouth 2 (two) times daily. 07/05/23   Mallipeddi, Diannah SQUIBB, MD    Allergies: Cefdinir and Metaxalone    Review of Systems  Musculoskeletal:  Positive for arthralgias and joint swelling.  Skin:  Negative for wound.  Neurological:  Negative for weakness and numbness.  All other systems reviewed and are negative.   Updated Vital Signs Ht 5' 1 (1.549 m)   Wt 125.6 kg   BMI 52.34 kg/m   Physical  Exam Vitals and nursing note reviewed.  Constitutional:      Appearance: She is well-developed.  HENT:     Head: Normocephalic.  Cardiovascular:     Rate and Rhythm: Normal rate.     Pulses: Normal pulses. No decreased pulses.          Dorsalis pedis pulses are 2+ on the right side and 2+ on the left side.       Posterior tibial pulses are 2+ on the right side and 2+ on the left side.  Musculoskeletal:        General: Swelling and tenderness present. No deformity.     Right ankle: Swelling and ecchymosis present. Tenderness present over the lateral malleolus. No proximal fibula tenderness. Decreased range of motion. Normal pulse.     Right Achilles Tendon: Normal.  Skin:    General: Skin is warm and dry.     Findings: No lesion.  Neurological:     Mental Status: She is alert.     Sensory: No sensory deficit.     (all labs ordered are listed, but only abnormal results are displayed) Labs Reviewed - No data to display  EKG: None  Radiology: DG Ankle Complete Right Result Date: 12/11/2023 EXAM: 3 or more VIEW(S) XRAY OF THE RIGHT ANKLE 12/11/2023 09:40:00 PM CLINICAL HISTORY: Trauma. Twisted ankle RE: edema in foot and ankle regions. COMPARISON: None available. FINDINGS: BONES AND JOINTS: No acute fracture.  No focal osseous lesion. No joint dislocation. SOFT TISSUES: The soft tissues are unremarkable. IMPRESSION: 1. No acute osseous abnormality. Electronically signed by: Morgane Naveau MD 12/11/2023 09:48 PM EDT RP Workstation: [id number]   DG Foot Complete Right Result Date: 12/11/2023 EXAM: 3 OR MORE VIEW(S) XRAY OF THE RIGHT FOOT 12/11/2023 08:21:22 PM COMPARISON: None available. CLINICAL HISTORY: 809823 Fall 190176. Pt states that on Saturday she rolled her R ankle, causing injury to the outer aspect of her foot. FINDINGS: BONES AND JOINTS: No acute fracture. No joint dislocation. Posterior and plantar calcaneal spur. At least moderate degenerative changes of the midfoot. SOFT  TISSUES: Dorsal forefoot subcutaneous soft tissue edema. IMPRESSION: 1. No acute fracture or dislocation. 2. Dorsal forefoot subcutaneous soft tissue edema. Electronically signed by: Morgane Naveau MD 12/11/2023 08:31 PM EDT RP Workstation: [id number]     Procedures   Medications Ordered in the ED  HYDROcodone-acetaminophen (NORCO/VICODIN) 5-325 MG per tablet 1 tablet (1 tablet Oral Given 12/11/23 2143)                                    Medical Decision Making Patient presenting with significant swelling and pain in her right foot, localizing to the fifth tarsal metatarsal joint.  Pain also radiates across her midfoot.  There is no fracture or dislocation based on imaging.  I suspect she has a significant sprain of her foot.  She is placed in a cam walker since she does not feel comfortable on crutches, I felt a cam walker would give her better foot support and comfort.  Advised using a cane to minimize weightbearing on the side.  Encouraged continued ice and elevation for pain and swelling.  She is given referral to orthopedics for follow-up care.  Amount and/or Complexity of Data Reviewed Radiology: ordered.    Details: Imaging reviewed, no fracture or dislocation about the right foot or ankle.  Risk Prescription drug management.        Final diagnoses:  Sprain of right foot, initial encounter    ED Discharge Orders          Ordered    HYDROcodone-acetaminophen (NORCO/VICODIN) 5-325 MG tablet  Every 6 hours PRN        12/11/23 2209               Amali Uhls, PA-C 12/11/23 2214    Cleotilde Rogue, MD 12/11/23 2300

## 2023-12-11 NOTE — ED Triage Notes (Signed)
 Pt states that on Saturday she rolled her R ankle, causing injury to the outer aspect of her foot.

## 2024-01-05 ENCOUNTER — Ambulatory Visit (HOSPITAL_COMMUNITY)

## 2024-02-07 ENCOUNTER — Ambulatory Visit (HOSPITAL_COMMUNITY): Admission: RE | Admit: 2024-02-07 | Discharge: 2024-02-07 | Attending: Internal Medicine

## 2024-02-07 DIAGNOSIS — I5022 Chronic systolic (congestive) heart failure: Secondary | ICD-10-CM | POA: Insufficient documentation

## 2024-02-07 LAB — ECHOCARDIOGRAM COMPLETE
Area-P 1/2: 3.99 cm2
Calc EF: 54.1 %
S' Lateral: 3.8 cm
Single Plane A2C EF: 55 %
Single Plane A4C EF: 52.2 %

## 2024-02-07 MED ORDER — PERFLUTREN LIPID MICROSPHERE
1.0000 mL | INTRAVENOUS | Status: AC | PRN
  Administered 2024-02-07: 10:00:00 3 mL via INTRAVENOUS

## 2024-02-07 NOTE — Progress Notes (Signed)
*  PRELIMINARY RESULTS* Echocardiogram 2D Echocardiogram has been performed with Definity .  Yvonne Mcmillan 02/07/2024, 10:34 AM

## 2024-02-19 ENCOUNTER — Ambulatory Visit: Payer: Self-pay | Admitting: Internal Medicine
